# Patient Record
Sex: Female | Born: 1985 | Hispanic: Yes | Marital: Married | Smoking: Never smoker
Health system: Southern US, Community
[De-identification: ages and names within clinical notes are randomized; demographics above are authoritative.]

## PROBLEM LIST (undated history)

## (undated) DIAGNOSIS — N6452 Nipple discharge: Secondary | ICD-10-CM

## (undated) DIAGNOSIS — Z8759 Personal history of other complications of pregnancy, childbirth and the puerperium: Secondary | ICD-10-CM

## (undated) DIAGNOSIS — R7989 Other specified abnormal findings of blood chemistry: Secondary | ICD-10-CM

## (undated) DIAGNOSIS — E229 Hyperfunction of pituitary gland, unspecified: Secondary | ICD-10-CM

## (undated) HISTORY — DX: Hyperfunction of pituitary gland, unspecified: E22.9

## (undated) HISTORY — DX: Personal history of other complications of pregnancy, childbirth and the puerperium: Z87.59

## (undated) HISTORY — DX: Other specified abnormal findings of blood chemistry: R79.89

## (undated) HISTORY — PX: NO PAST SURGERIES: SHX2092

## (undated) HISTORY — DX: Nipple discharge: N64.52

---

## 2017-06-04 ENCOUNTER — Encounter: Payer: Self-pay | Admitting: Advanced Practice Midwife

## 2017-06-04 ENCOUNTER — Ambulatory Visit: Payer: BLUE CROSS/BLUE SHIELD | Admitting: Advanced Practice Midwife

## 2017-06-04 VITALS — BP 124/70 | HR 59 | Ht <= 58 in | Wt 133.0 lb

## 2017-06-04 DIAGNOSIS — N920 Excessive and frequent menstruation with regular cycle: Secondary | ICD-10-CM | POA: Diagnosis not present

## 2017-06-04 MED ORDER — NORETHIN ACE-ETH ESTRAD-FE 1-20 MG-MCG PO TABS: 1.0000 | ORAL_TABLET | Freq: Every day | ORAL | 5 refills | Status: DC

## 2017-06-04 NOTE — Progress Notes (Signed)
S: The patient is here today with complaint of heavy and prolonged bleeding with her cycle. She had Nexplanon inserted in 2015 and then removed/reinserted in March of this year. With the first Nexplanon she did not have any bleeding. Since having the new Nexplanon she has had monthly bleeding since May. Most of the months since May she has had 8 day cycles. In September the bleeding lasted for 15 days. Currently she has been bleeding since November 20th. She says the bleeding is heavy with clots. She is planning a pregnancy sometime in 2015 and so prefers to continue with the current Nexplanon. Discussion of normal variations in bleeding pattern with Nexplanon. She would like to try adding an OCP for a few months to see if that decreases her bleeding.   O: Vital Signs: BP 124/70 (BP Location: Left Arm, Patient Position: Sitting, Cuff Size: Normal)   Pulse (!) 59   Ht 4\' 9"  (1.448 m)   Wt 133 lb (60.3 kg)   LMP 05/11/2017   BMI 28.78 kg/m  Constitutional: Well nourished, well developed female in no acute distress.  HEENT: normal Skin: Warm and dry.  Cardiovascular: Regular rate and rhythm.   Psych: Alert and Oriented x3. No memory deficits. Normal mood and affect.   A: 31 yo female with menorrhagia on Nexplanon  P: Rx for Oral combined pill Return to clinic PRN  Tresea MallJane Bow Buntyn, CNM

## 2017-12-21 ENCOUNTER — Emergency Department
Admission: EM | Admit: 2017-12-21 | Discharge: 2017-12-22 | Disposition: A | Discharge status: Home / Self Care | Payer: BLUE CROSS/BLUE SHIELD | Attending: Emergency Medicine | Admitting: Emergency Medicine

## 2017-12-21 ENCOUNTER — Encounter: Payer: Self-pay | Admitting: Emergency Medicine

## 2017-12-21 ENCOUNTER — Emergency Department: Payer: BLUE CROSS/BLUE SHIELD

## 2017-12-21 ENCOUNTER — Other Ambulatory Visit: Payer: Self-pay

## 2017-12-21 DIAGNOSIS — O200 Threatened abortion: Secondary | ICD-10-CM | POA: Insufficient documentation

## 2017-12-21 DIAGNOSIS — Z79899 Other long term (current) drug therapy: Secondary | ICD-10-CM | POA: Diagnosis not present

## 2017-12-21 DIAGNOSIS — R102 Pelvic and perineal pain: Secondary | ICD-10-CM | POA: Diagnosis not present

## 2017-12-21 DIAGNOSIS — Z3A01 Less than 8 weeks gestation of pregnancy: Secondary | ICD-10-CM | POA: Insufficient documentation

## 2017-12-21 DIAGNOSIS — O209 Hemorrhage in early pregnancy, unspecified: Secondary | ICD-10-CM | POA: Diagnosis present

## 2017-12-21 LAB — CBC
HCT: 41.3 % (ref 35.0–47.0)
Hemoglobin: 14.3 g/dL (ref 12.0–16.0)
MCH: 30.8 pg (ref 26.0–34.0)
MCHC: 34.6 g/dL (ref 32.0–36.0)
MCV: 88.9 fL (ref 80.0–100.0)
PLATELETS: 248 10*3/uL (ref 150–440)
RBC: 4.64 MIL/uL (ref 3.80–5.20)
RDW: 13.7 % (ref 11.5–14.5)
WBC: 8.4 10*3/uL (ref 3.6–11.0)

## 2017-12-21 LAB — HCG, QUANTITATIVE, PREGNANCY: hCG, Beta Chain, Quant, S: 8 m[IU]/mL — ABNORMAL HIGH (ref <5)

## 2017-12-21 NOTE — ED Triage Notes (Signed)
Pt reports she is approximately [redacted] weeks pregnant and an hour ago she sated to have heavy vaginal bleeding. Pt reports she has had 1 miscarriage in past, no living children.

## 2017-12-21 NOTE — ED Provider Notes (Signed)
[facility] Emergency Department Provider Note   ____________________________________________   First MD Initiated Contact with Patient 12/21/17 2217     (approximate)  I have reviewed the triage vital signs and the nursing notes.   HISTORY  Chief Complaint Vaginal Bleeding   HP Cristina Weber is a 32 y.o. female who thinks she is about [redacted] weeks pregnant.  She began having heavy vaginal bleeding an hour ago with cramping.  She has 1 miscarriage no living children.  She still having some cramping and bleeding now.  She told me she had her last menstrual period 27 may.   History reviewed. No pertinent past medical history.  There are no active problems to display for this patient.   History reviewed. No pertinent surgical history.  Prior to Admission medications   Medication Sig Start Date End Date Taking? Authorizing Provider  norethindrone-ethinyl estradiol (JUNEL FE 1/20) 1-20 MG-MCG tablet Take 1 tablet by mouth daily. 06/04/17   Tresea Mall, CNM  TRI-LUMA 0.01-4-0.05 % CREA  04/27/17   [provider]    Allergies Aspirin  History reviewed. No pertinent family history.  Social History Social History   Tobacco Use  . Smoking status: Never Smoker  . Smokeless tobacco: Never Used  Substance Use Topics  . Alcohol use: No    Frequency: Never  . Drug use: No    Review of Systems  Constitutional: No fever/chills Eyes: No visual changes. ENT: No sore throat. Cardiovascular: Denies chest pain. Respiratory: Denies shortness of breath. Gastrointestinal: Lower abdominal pain.  No nausea, no vomiting.  No diarrhea.  No constipation. Genitourinary: Negative for dysuria. Musculoskeletal: Negative for back pain. Skin: Negative for rash. Neurological: Negative for headaches, focal weakness   ____________________________________________   PHYSICAL EXAM:  VITAL SIGNS: ED Triage Vitals  Enc Vitals Group     BP 12/21/17 2144 (!)  143/76     Pulse Rate 12/21/17 2144 61     Resp 12/21/17 2144 18     Temp 12/21/17 2144 97.9 F (36.6 C)     Temp Source 12/21/17 2144 Oral     SpO2 12/21/17 2144 100 %     Weight 12/21/17 2143 132 lb (59.9 kg)     Height 12/21/17 2143 4\' 9"  (1.448 m)     Head Circumference --      Peak Flow --      Pain Score --      Pain Loc --      Pain Edu? --      Excl. in GC? --     Constitutional: Alert and oriented. Well appearing and in no acute distress. Eyes: Conjunctivae are normal. Head: Atraumatic. Nose: No congestion/rhinnorhea. Mouth/Throat: Mucous membranes are moist.  Oropharynx non-erythematous. Neck: No stridor.   Cardiovascular: Normal rate, regular rhythm. Grossly normal heart sounds.  Good peripheral circulation. Respiratory: Normal respiratory effort.  No retractions. Lungs CTAB. Gastrointestinal: Soft and lower abdominal tenderness no distention. No abdominal bruits. No CVA tenderness. Genitourinary: Pending ultrasound Musculoskeletal: No lower extremity tenderness nor edema.  No joint effusions. Neurologic:  Normal speech and language. No gross focal neurologic deficits are appreciated. No gait instability. Skin:  Skin is warm, dry and intact. No rash noted. Psychiatric: Mood and affect are normal. Speech and behavior are normal.  ____________________________________________   LABS (all labs ordered are listed, but only abnormal results are displayed)  Labs Reviewed  WET PREP, GENITAL - Abnormal; Notable for the following components:      Result  Value   WBC, Wet Prep HPF POC FEW (*)    All other components within normal limits  HCG, QUANTITATIVE, PREGNANCY - Abnormal; Notable for the following components:   hCG, Beta Chain, Quant, S 8 (*)    All other components within normal limits  CHLAMYDIA/NGC RT PCR ([facility] ONLY)  CBC  ABO/RH    ____________________________________________  EKG   ____________________________________________  RADIOLOGY *  Official radiology report(s): US Ob Comp Less 14 Wks  Result Date: 12/22/2017 CLINICAL DATA:  32 year old pregnant female with vaginal bleeding for 2 hours. Beta HCG of 8. Estimated gestational age of [redacted] weeks 1 day by LMP. EXAM: OBSTETRIC <14 WK [place] AND TRANSVAGINAL OB US TECHNIQUE: Both transabdominal and transvaginal ultrasound examinations were performed for complete evaluation of the gestation as well as the maternal uterus, adnexal regions, and pelvic cul-de-sac. Transvaginal technique was performed to assess early pregnancy. COMPARISON:  None. FINDINGS: Intrauterine gestational sac: None Yolk sac:  Not Visualized. Embryo:  Not Visualized. Maternal uterus/adnexae: The uterus and ovaries are unremarkable. Endometrial stripe is unremarkable measuring 10 mm. A trace amount of free pelvic fluid is noted. No adnexal masses. IMPRESSION: 1. No IUP or adnexal mass visualized. Differential diagnosis includes recent spontaneous abortion, IUP too early to visualize, and occult ectopic pregnancy. Recommend close follow up of quantitative B-HCG levels, and follow up US as clinically warranted. Electronically Signed   By: Harmon Pier M.D.   On: 12/22/2017 00:02   US Ob Transvaginal  Result Date: 12/22/2017 CLINICAL DATA:  32 year old pregnant female with vaginal bleeding for 2 hours. Beta HCG of 8. Estimated gestational age of [redacted] weeks 1 day by LMP. EXAM: OBSTETRIC <14 WK [place] AND TRANSVAGINAL OB US TECHNIQUE: Both transabdominal and transvaginal ultrasound examinations were performed for complete evaluation of the gestation as well as the maternal uterus, adnexal regions, and pelvic cul-de-sac. Transvaginal technique was performed to assess early pregnancy. COMPARISON:  None. FINDINGS: Intrauterine gestational sac: None Yolk sac:  Not Visualized. Embryo:  Not Visualized. Maternal uterus/adnexae: The  uterus and ovaries are unremarkable. Endometrial stripe is unremarkable measuring 10 mm. A trace amount of free pelvic fluid is noted. No adnexal masses. IMPRESSION: 1. No IUP or adnexal mass visualized. Differential diagnosis includes recent spontaneous abortion, IUP too early to visualize, and occult ectopic pregnancy. Recommend close follow up of quantitative B-HCG levels, and follow up US as clinically warranted. Electronically Signed   By: Harmon Pier M.D.   On: 12/22/2017 00:02    ____________________________________________   PROCEDURES  Procedure(s) performed:   Procedures  Critical Care performed:   ____________________________________________   INITIAL IMPRESSION / ASSESSMENT AND PLAN / ED COURSE  I discussed the patient with Dr. Valentino Saxon she will follow-up.  I also told the patient and her husband that this could be a completed miscarriage and she still bleeding or could be a very early pregnancy which is cannot see it yet but they need to return for worse pain bleeding or any other problems.  And they need to give Dr. Oretha Milch office and [facility] a call in the morning and set up a follow-up in 2 days.  I understand this.         ____________________________________________   FINAL CLINICAL IMPRESSION(S) / ED DIAGNOSES  Final diagnoses:  Threatened miscarriage     ED Discharge Orders    None       Note:  This document was prepared using Dragon voice recognition software and may include unintentional dictation errors.  Arnaldo NatalMalinda, Tayvian Holycross F, MD 12/22/17 [phone]

## 2017-12-21 NOTE — ED Notes (Signed)
Patient transported to Ultrasound 

## 2017-12-22 ENCOUNTER — Ambulatory Visit: Payer: BLUE CROSS/BLUE SHIELD | Admitting: Obstetrics and Gynecology

## 2017-12-22 ENCOUNTER — Encounter: Payer: Self-pay | Admitting: Obstetrics and Gynecology

## 2017-12-22 VITALS — BP 116/76 | HR 64 | Ht 59.0 in | Wt 130.5 lb

## 2017-12-22 DIAGNOSIS — O039 Complete or unspecified spontaneous abortion without complication: Secondary | ICD-10-CM

## 2017-12-22 LAB — WET PREP, GENITAL
CLUE CELLS WET PREP: NONE SEEN
Sperm: NONE SEEN
TRICH WET PREP: NONE SEEN
YEAST WET PREP: NONE SEEN

## 2017-12-22 LAB — CHLAMYDIA/NGC RT PCR (ARMC ONLY)
Chlamydia Tr: NOT DETECTED
N gonorrhoeae: NOT DETECTED

## 2017-12-22 LAB — ABO/RH: ABO/RH(D): O POS

## 2017-12-22 NOTE — ED Notes (Signed)
MD at bedside to update family and discuss ultrasound results

## 2017-12-22 NOTE — Discharge Instructions (Addendum)
Please follow-up with Dr. Valentino Saxonherry who is on-call for [facility].  Please return for worse pain fever or bleeding.  Also please return if you feel weak or lightheaded.

## 2017-12-22 NOTE — Patient Instructions (Signed)
Aborto espontáneo °(Miscarriage) °El aborto espontáneo es la pérdida de un bebé que no ha nacido (feto) antes de la semana 20 del embarazo. La mayor parte de estos abortos ocurre en los primeros 3 meses. En algunos casos ocurre antes de que la mujer sepa que está embarazada. También se denomina "aborto espontáneo" o "pérdida prematura del embarazo". El aborto espontáneo puede ser una experiencia que afecte emocionalmente a la persona. Converse con su médico si tiene dudas, cómo es el proceso de duelo, y sobre planes futuros de embarazo. °CAUSAS °· Algunos problemas cromosómicos pueden hacer imposible que el bebé se desarrolle normalmente. Los problemas con los genes o cromosomas del bebé son generalmente el resultado de errores que se producen, por casualidad, cuando el embrión se divide y crece. Estos problemas no se heredan de los padres. °· Infección en el cuello del útero. °· Problemas hormonales. °· Problemas en el cuello del útero, como tener un útero incompetente. Esto ocurre cuando los tejidos no son lo suficientemente fuertes como para contener el embarazo. °· Problemas del útero, como un útero con forma anormal, los fibromas o anormalidades congénitas. °· Ciertas enfermedades crónicas. °· No fume, no beba alcohol, ni consuma drogas. °· Traumatismos °A veces, la causa es desconocida. °SÍNTOMAS °· Sangrado o manchado vaginal, con o sin cólicos o dolor. °· Dolor o cólicos en el abdomen o en la cintura. °· Eliminación de líquido, tejidos o coágulos grandes por la vagina. ° °DIAGNÓSTICO °El médico le hará un examen físico. También le indicará una ecografía para confirmar el aborto. Es posible que se realicen análisis de sangre. °TRATAMIENTO °· En algunos casos el tratamiento no es necesario, si se eliminan naturalmente todos los tejidos embrionarios que se encontraban en el útero. Si el feto o la placenta quedan dentro del útero (aborto incompleto), pueden infectarse, los tejidos que quedan pueden infectarse y  deben retirarse. Generalmente se realiza un procedimiento de dilatación y curetaje (D y C). Durante el procedimiento de dilatación y curetaje, el cuello del útero se abre (dilata) y se retira cualquier resto de tejido fetal o placentario del útero. °· Si hay una infección, le recetarán antibióticos. Podrán recetarle otros medicamentos para reducir el tamaño del útero (contraerlo) si hay una mucho sangrado. °· Si su sangre es Rh negativa y su bebé es Rh positivo, usted necesitará la inyección de inmunoglobulina Rh. Esta inyección protegerá a los futuros bebés de tener problemas de compatibilidad Rh en futuros embarazos. ° °INSTRUCCIONES PARA EL CUIDADO EN EL HOGAR °· El médico le indicará reposo en cama o le permitirá realizar actividades livianas. Vuelva a la actividad lentamente o según las indicaciones de su médico. °· Pídale a alguien que la ayude con las responsabilidades familiares y del hogar durante este tiempo. °· Lleve un registro de la cantidad y la saturación de las toallas higiénicas que utiliza cada día. Anote esta información °· No use tampones. No No se haga duchas vaginales ni tenga relaciones sexuales hasta que el médico la autorice. °· Sólo tome medicamentos de venta libre o recetados para calmar el dolor o el malestar, según las indicaciones de su médico. °· No tome aspirina. La aspirina puede ocasionar hemorragias. °· Concurra puntualmente a las citas de control con el médico. °· Si usted o su pareja tienen dificultades con el duelo, hable con su médico para buscar la ayuda psicológica que los ayude a enfrentar la pérdida del embarazo. Permítase el tiempo suficiente de duelo antes de quedar embarazada nuevamente. ° °SOLICITE ATENCIÓN MÉDICA DE INMEDIATO SI: °·   Siente calambres intensos o dolor en la espalda o en el abdomen. °· Tiene fiebre. °· Elimina grandes coágulos de sangre (del tamaño de una nuez o más) o tejidos por la vagina. Guarde lo que ha eliminado para que su médico lo examine. °· La  hemorragia aumenta. °· Observa una secreción vaginal espesa y con mal olor. °· Se siente mareada, débil, o se desmaya. °· Siente escalofríos. ° °ASEGÚRESE DE QUE: °· Comprende estas instrucciones. °· Controlará su enfermedad. °· Solicitará ayuda de inmediato si no mejora o si empeora. ° °Esta información no tiene como fin reemplazar el consejo del médico. Asegúrese de hacerle al médico cualquier pregunta que tenga. °Document Released: 03/18/2005 Document Revised: 10/03/2012 Document Reviewed: 07/28/2011 °Elsevier Interactive Patient Education © 2017 Elsevier Inc. ° °

## 2017-12-22 NOTE — Progress Notes (Signed)
  Subjective:     Patient ID: Cristina Weber, female   DOB: 09/25/1985, 32 y.o.   MRN: [id number]  HPI Here for f/u from ED visit yesterday. Was diagnosed with miscarriage. States bleeding had got heavier over night but now less, moderate cramping not relieved by tylenol.   Does report they were actively trying for pregnancy, and had a spontaneous miscarriage at [redacted] weeks gestation 5 years ago. Otherwise never been pregnant to term. 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  Denies any health issues.  Review of Systems  Constitutional: Negative.   HENT: Negative.   Eyes: Negative.   Respiratory: Negative.   Cardiovascular: Negative.   Gastrointestinal: Negative.   Endocrine: Negative.   Genitourinary: Negative.   Musculoskeletal: Negative.   Allergic/Immunologic: Negative.   Neurological: Negative.   Hematological: Negative.   Psychiatric/Behavioral: Negative.        Objective:   Physical Exam A&O x4 Well groomed female, mildly tearful. Blood pressure 116/76, pulse 64, height 4\' 11"  (1.499 m), weight 130 lb 8 oz (59.2 kg), last menstrual period 11/15/2017. Pelvic exam: normal external genitalia, vulva, vagina, cervix, uterus and adnexa, moderate dark rubra noted.    Assessment:     miscarriage    Plan:     Labs obtained-will follow up accordingly. Counseled at length on miscarriage, common causes, and future pregnancy options. RTC in 2 weeks for follow up ultrasound and more counseling on future pregnancy attempts.   > 50 of 30 minute visit spent in counseling.  Talik Casique,CMN

## 2017-12-28 ENCOUNTER — Encounter: Payer: Self-pay | Admitting: Obstetrics and Gynecology

## 2017-12-28 LAB — CBC WITH DIFFERENTIAL/PLATELET
BASOS ABS: 0 10*3/uL (ref 0.0–0.2)
BASOS: 0 %
EOS (ABSOLUTE): 0 10*3/uL (ref 0.0–0.4)
EOS: 0 %
HEMATOCRIT: 43.6 % (ref 34.0–46.6)
HEMOGLOBIN: 14 g/dL (ref 11.1–15.9)
IMMATURE GRANS (ABS): 0 10*3/uL (ref 0.0–0.1)
Immature Granulocytes: 0 %
LYMPHS ABS: 1.6 10*3/uL (ref 0.7–3.1)
Lymphs: 15 %
MCH: 29.4 pg (ref 26.6–33.0)
MCHC: 32.1 g/dL (ref 31.5–35.7)
MCV: 92 fL (ref 79–97)
MONOCYTES: 5 %
Monocytes Absolute: 0.6 10*3/uL (ref 0.1–0.9)
NEUTROS ABS: 8.6 10*3/uL — AB (ref 1.4–7.0)
Neutrophils: 80 %
Platelets: 267 10*3/uL (ref 150–450)
RBC: 4.76 x10E6/uL (ref 3.77–5.28)
RDW: 13.7 % (ref 12.3–15.4)
WBC: 10.8 10*3/uL (ref 3.4–10.8)

## 2017-12-28 LAB — TOXOPLASMA GONDII ANTIBODY, IGG

## 2017-12-28 LAB — BETA HCG QUANT (REF LAB): HCG QUANT: 4 m[IU]/mL

## 2017-12-28 LAB — HSV 1 ANTIBODY, IGG: HSV 1 Glycoprotein G Ab, IgG: 38.6 index — ABNORMAL HIGH (ref 0.00–0.90)

## 2017-12-28 LAB — B12 AND FOLATE PANEL
Folate: 18.8 ng/mL (ref >3.0)
Vitamin B-12: 518 pg/mL (ref 232–1245)

## 2017-12-28 LAB — ANA,IFA RA DIAG PNL W/RFLX TIT/PATN
ANA TITER 1: NEGATIVE
Cyclic Citrullin Peptide Ab: 7 units (ref 0–19)
Rhuematoid fact SerPl-aCnc: <10 IU/mL (ref 0.0–13.9)

## 2017-12-28 LAB — HIV ANTIBODY (ROUTINE TESTING W REFLEX): HIV SCREEN 4TH GENERATION: NONREACTIVE

## 2017-12-28 LAB — HSV 2 ANTIBODY, IGG

## 2017-12-28 LAB — VITAMIN D 25 HYDROXY (VIT D DEFICIENCY, FRACTURES): Vit D, 25-Hydroxy: 28.1 ng/mL — ABNORMAL LOW (ref 30.0–100.0)

## 2017-12-28 LAB — RUBELLA SCREEN: Rubella Antibodies, IGG: 17.4 index (ref >0.99)

## 2017-12-28 LAB — CMV ANTIBODY, IGG (EIA): CMV AB - IGG: 1.7 U/mL — AB (ref 0.00–0.59)

## 2017-12-28 LAB — THYROID PANEL WITH TSH
Free Thyroxine Index: 2 (ref 1.2–4.9)
T3 UPTAKE RATIO: 32 % (ref 24–39)
T4, Total: 6.3 ug/dL (ref 4.5–12.0)
TSH: 1.24 u[IU]/mL (ref 0.450–4.500)

## 2017-12-28 LAB — RPR: RPR Ser Ql: NONREACTIVE

## 2018-01-04 ENCOUNTER — Ambulatory Visit (INDEPENDENT_AMBULATORY_CARE_PROVIDER_SITE_OTHER): Payer: BLUE CROSS/BLUE SHIELD

## 2018-01-04 ENCOUNTER — Ambulatory Visit (INDEPENDENT_AMBULATORY_CARE_PROVIDER_SITE_OTHER): Payer: BLUE CROSS/BLUE SHIELD | Admitting: Obstetrics and Gynecology

## 2018-01-04 ENCOUNTER — Encounter: Payer: Self-pay | Admitting: Obstetrics and Gynecology

## 2018-01-04 ENCOUNTER — Other Ambulatory Visit: Payer: BLUE CROSS/BLUE SHIELD

## 2018-01-04 VITALS — BP 103/70 | HR 55 | Ht <= 58 in | Wt 129.1 lb

## 2018-01-04 DIAGNOSIS — Z5189 Encounter for other specified aftercare: Secondary | ICD-10-CM | POA: Diagnosis not present

## 2018-01-04 DIAGNOSIS — N8301 Follicular cyst of right ovary: Secondary | ICD-10-CM | POA: Diagnosis not present

## 2018-01-04 DIAGNOSIS — O039 Complete or unspecified spontaneous abortion without complication: Secondary | ICD-10-CM | POA: Diagnosis not present

## 2018-01-04 NOTE — Progress Notes (Signed)
  Subjective:     Patient ID: Cristina SakaiLuisa Devery, female   DOB: 06/15/1986, 32 y.o.   MRN: [id number]  HPI  Here to review ultrasound as follow up to miscarriage. Reports moderate cramping and bleeding for 7 days, but has felt fine since then. Reviewed labs obtained on 12/21/17.    Review of Systems negative    Objective:   Physical Exam   A&O Well groomed female in no distress Vitals:   01/04/18 1041  Weight: 129 lb 1.6 oz (58.6 kg)  Height: 4\' 10"  (1.473 m)    Ultrasound Findings:  The uterus measures 8.7 x 4.7 x 3.7 cm. Echo texture is homogeneous without evidence of focal masses. The Endometrium measures 12.4 mm.  Right Ovary measures 3.5 x 2.7 x 2.7 cm and contains a dominant follicle/small simple cyst (probably physiological) measuring 1.4 x 1.9 x 1.7 cm. It is normal in appearance. Left Ovary measures 2.5 x 1.8 x 1.6 cm. It is normal appearance. Survey of the adnexa demonstrates no adnexal masses. There is no free fluid in the cul de sac.  Impression: 1. Anteverted uterus appears of normal size and contour. 2. Endometrium measures 12.4 mm. 3. Bilateral ovaries appear WNL. - Right ovary contains probable physiological dominant follicle/simple cyst measuring 1.4 x 1.9 x 1.7 cm.    Assessment:     F/u miscarriage    Plan:     Reassured of normal findings. Recommended waiting till after next menses before trying for another pregnancy. To call the office with next UPT+ and come in for progesterone labs and quant bHcg.   Jonda Alanis,CNM

## 2018-01-10 ENCOUNTER — Telehealth: Payer: Self-pay | Admitting: Obstetrics and Gynecology

## 2018-01-10 NOTE — Telephone Encounter (Signed)
The patients husband called and stated that se would like to speak with a nurse in regards to the patients most recent bloodwork to get her results. Please advise.

## 2018-02-11 ENCOUNTER — Encounter: Payer: Self-pay | Admitting: Obstetrics and Gynecology

## 2018-02-11 ENCOUNTER — Ambulatory Visit: Payer: BLUE CROSS/BLUE SHIELD | Admitting: Obstetrics and Gynecology

## 2018-02-11 VITALS — BP 138/94 | HR 68 | Ht <= 58 in | Wt 131.0 lb

## 2018-02-11 DIAGNOSIS — N643 Galactorrhea not associated with childbirth: Secondary | ICD-10-CM | POA: Diagnosis not present

## 2018-02-11 NOTE — Progress Notes (Signed)
  Subjective:     Patient ID: Cristina Weber, female   DOB: 02/13/1986, 32 y.o.   MRN: [id number]  HPI Reports noticing milky white discharge out of both breast this am. Denies tenderness or other breast changes. Reports have similar episode about 4-5 years ago. Did have the flu 4 days ago with headache, low grade fever, and malaise.  Also is s/p missed abortion last month. States LMP 01/20/18 with lighted bleeding and clots small with mucus. Has had unprotected sex since then.   Review of Systems  Constitutional: Positive for fever.  Eyes: Negative.   Respiratory: Negative.   Cardiovascular: Negative.   Gastrointestinal: Negative.   Endocrine: Negative.   Genitourinary: Negative.   Musculoskeletal: Negative.   Neurological: Positive for dizziness, light-headedness and headaches.  Hematological: Negative.   Psychiatric/Behavioral: Negative.        Objective:   Physical Exam A&Ox4 Well groomed female in no distress Blood pressure (!) 138/94, pulse 68, height 4\' 9"  (1.448 m), weight 131 lb (59.4 kg), last menstrual period 01/20/2018, unknown if currently breastfeeding. Breasts: breasts appear normal, no suspicious masses, no skin or nipple changes or axillary nodes, symmetric fibrous changes in both upper outer quadrants.    Assessment:     galactorrhea    Plan:     Counseled on normal causes of nipple discharge and recommended labs. Will return next day for labs and we will follow up accordingly.  Mohogany Toppins,CNM

## 2018-02-11 NOTE — Patient Instructions (Signed)
Galactorrhea  Galactorrhea is the flow of a milky fluid (discharge) from the breast. It is different from normal milk in nursing mothers. It can be caused by many things. Most cases are not serious and do not require treatment. Watch your condition to make sure it goes away.  Follow these instructions at home:   Take medicines only as told by your doctor.   Do not squeeze your breasts or nipples.   Avoid any touching of your breasts during sexual activity.   Perform a breast self-exam only once a month.   Avoid clothes that rub on your nipples.   Use breast pads to absorb the fluid.   Wear a support bra or a breast binder.   Keep all follow-up visits as told by your doctor. This is important.  Contact a doctor if:   You have hot flashes.   You have vaginal dryness.   You have a lack of sexual desire.   You stop having menstrual periods, or they are far apart or not regular.   You have headaches.   You have vision problems.  Get help right away if:   Your breast discharge is bloody or yellowish white (puslike).   You have breast pain.   You feel a lump in your breast.   Your breast shows wrinkling or dimpling.   Your breast becomes red and swollen.  This information is not intended to replace advice given to you by your health care provider. Make sure you discuss any questions you have with your health care provider.  Document Released: 07/11/2010 Document Revised: 11/14/2015 Document Reviewed: 01/09/2014  Elsevier Interactive Patient Education  2018 Elsevier Inc.

## 2018-02-14 ENCOUNTER — Other Ambulatory Visit: Payer: BLUE CROSS/BLUE SHIELD

## 2018-02-14 NOTE — Addendum Note (Signed)
Addended by: Brooke DareSICK, Chemere Steffler L on: 02/14/2018 08:35 AM   Modules accepted: Orders

## 2018-02-15 ENCOUNTER — Ambulatory Visit (INDEPENDENT_AMBULATORY_CARE_PROVIDER_SITE_OTHER): Payer: BLUE CROSS/BLUE SHIELD | Admitting: Obstetrics and Gynecology

## 2018-02-15 ENCOUNTER — Encounter: Payer: Self-pay | Admitting: Obstetrics and Gynecology

## 2018-02-15 VITALS — BP 148/80 | HR 58 | Ht <= 58 in | Wt 129.1 lb

## 2018-02-15 DIAGNOSIS — N643 Galactorrhea not associated with childbirth: Secondary | ICD-10-CM

## 2018-02-15 DIAGNOSIS — E229 Hyperfunction of pituitary gland, unspecified: Secondary | ICD-10-CM

## 2018-02-15 DIAGNOSIS — R7989 Other specified abnormal findings of blood chemistry: Secondary | ICD-10-CM

## 2018-02-15 LAB — BETA HCG QUANT (REF LAB): hCG Quant: <1 m[IU]/mL

## 2018-02-15 LAB — PROGESTERONE: Progesterone: 0.2 ng/mL

## 2018-02-15 LAB — PROLACTIN: Prolactin: 57.6 ng/mL — ABNORMAL HIGH (ref 4.8–23.3)

## 2018-02-15 NOTE — Patient Instructions (Signed)
steps you can take to lower your prolactin levels include:  changing your diet and keeping your stress levels down stopping high-intensity workouts or activities that overwhelm you avoiding clothing that makes your chest uncomfortable avoiding activities and clothing that overstimulate your nipples taking vitamin B-6 and vitamin E supplements

## 2018-02-15 NOTE — Progress Notes (Signed)
  Subjective:     Patient ID: Cristina Weber, female   DOB: 11/27/1985, 32 y.o.   MRN: [id number]  HPI Reports seeing clear to blood bilateral expressed out of both nipples. Concerned as she feels like it is getting worse and also wanted to review labs obtained yesterday. Just started menses. Denies breast pain or any other changes.   Review of Systems  All other systems reviewed and are negative.      Objective:   Physical Exam A&OX4 Well groomed female Blood pressure (!) 148/80, pulse (!) 58, height 4\' 9"  (1.448 m), weight 129 lb 1.6 oz (58.6 kg), last menstrual period 01/20/2018, unknown if currently breastfeeding. Breasts: breasts appear normal, no suspicious masses, no skin or nipple changes or axillary nodes, bilateral clear nipple discharge with scant blood in the discharge noted. Thyroid normal on exam.     Assessment:     Galactorrhea of both breast Elevated prolactin levels    Plan:     More labs obtained. Counseled on common causes of elevated prolactin/galactorrhea including recent miscarriage, thyroid disorders, stress, certain foods and medications and pituitary tumor. Diagnostic MMG and breasts ultrasound ordered and will follow up accordingly.   Melody Shambley,CNM

## 2018-02-16 LAB — THYROID PANEL WITH TSH
Free Thyroxine Index: 2.3 (ref 1.2–4.9)
T3 Uptake Ratio: 31 % (ref 24–39)
T4, Total: 7.5 ug/dL (ref 4.5–12.0)
TSH: 2.25 u[IU]/mL (ref 0.450–4.500)

## 2018-02-16 LAB — FSH/LH
FSH: 5.2 m[IU]/mL
LH: 5.5 m[IU]/mL

## 2018-02-16 LAB — PARATHYROID HORMONE, INTACT (NO CA): PTH: 32 pg/mL (ref 15–65)

## 2018-02-17 ENCOUNTER — Ambulatory Visit
Admission: RE | Admit: 2018-02-17 | Discharge: 2018-02-17 | Disposition: A | Discharge status: Home / Self Care | Payer: BLUE CROSS/BLUE SHIELD | Source: Ambulatory Visit | Attending: Obstetrics and Gynecology | Admitting: Obstetrics and Gynecology

## 2018-02-17 DIAGNOSIS — N643 Galactorrhea not associated with childbirth: Secondary | ICD-10-CM

## 2018-02-28 ENCOUNTER — Telehealth: Payer: Self-pay | Admitting: Obstetrics and Gynecology

## 2018-02-28 NOTE — Telephone Encounter (Signed)
The patient's spouse called and stated that the patient is still experiencing some severe symptoms, The patient is having headache, discharge, and would like to know the results from the last exam she had done. No other information was disclosed. Please advise.

## 2018-03-01 NOTE — Telephone Encounter (Signed)
pls advise, looks like she needs further testing??

## 2018-03-01 NOTE — Telephone Encounter (Signed)
Can you please review this patients chart and see if you have any recommendations?

## 2018-03-01 NOTE — Telephone Encounter (Signed)
The patient's Prolactin was elevated back in August. She should have it repeated now and if still elevated, would need a referral to an endocrinologist to rule out a possible prolactinoma in light of her headaches and persistent milky nipple discharge.  She also needs a referral to a General Surgery for further follow up of her mammogram as suggested by the radiologist due to uniductal expression of the bloody nipple discharge.

## 2018-03-02 ENCOUNTER — Ambulatory Visit (INDEPENDENT_AMBULATORY_CARE_PROVIDER_SITE_OTHER): Payer: BLUE CROSS/BLUE SHIELD | Admitting: Surgery

## 2018-03-02 ENCOUNTER — Other Ambulatory Visit: Payer: Self-pay | Admitting: Obstetrics and Gynecology

## 2018-03-02 ENCOUNTER — Encounter: Payer: Self-pay | Admitting: Surgery

## 2018-03-02 VITALS — BP 122/80 | HR 80 | Temp 97.9°F | Ht <= 58 in | Wt 127.0 lb

## 2018-03-02 DIAGNOSIS — R7989 Other specified abnormal findings of blood chemistry: Secondary | ICD-10-CM

## 2018-03-02 DIAGNOSIS — E229 Hyperfunction of pituitary gland, unspecified: Principal | ICD-10-CM

## 2018-03-02 DIAGNOSIS — N6452 Nipple discharge: Secondary | ICD-10-CM

## 2018-03-02 DIAGNOSIS — N643 Galactorrhea not associated with childbirth: Secondary | ICD-10-CM

## 2018-03-02 NOTE — Telephone Encounter (Signed)
Discussed concerns and need for follow up lab and surgery consult with spouse. She ill come in Friday morning of this week for labs. Orders placed.

## 2018-03-02 NOTE — Patient Instructions (Addendum)
La vamos a ver en 3 meses para estar seguro que no le siga saliendo sangre del pezon.  El Dr. Aleen Campi recomienda que no quede embarazada por 3 meses para estar seguro que no sea nada serio.

## 2018-03-03 ENCOUNTER — Encounter: Payer: Self-pay | Admitting: Surgery

## 2018-03-03 NOTE — Progress Notes (Signed)
03/03/2018  Reason for Visit:  Left breast bloody discharge from nipple  Referring Provider:  Harlow MaresMelody Shambley, CNM  History of Present Illness: Cristina Weber is a 32 y.o. female who presents with new diagnosis of left breast bloody nipple discharge.  She reports that about three weeks ago she noticed bilateral breast milk discharge.  She had an abortion about two months ago.  Her prolactin level was also elevated.  However, she also had a few days after milk discharge onset, bloody discharge from the left nipple only.  She had a mammogram and ultrasound on 02/17/18.  This showed no suspicious findings or masses, particularly on the left breast.  The bloody discharge stopped a few days after the mammogram and has not returned since.  She denies any breast tenderness, skin changes, nipple retraction, or any palpable masses.  There is no history in her family of breast cancer.  Past Medical History: Past Medical History:  Diagnosis Date  . History of miscarriage      Past Surgical History: Past Surgical History:  Procedure Laterality Date  . NO PAST SURGERIES      Home Medications: Prior to Admission medications   Medication Sig Start Date End Date Taking? Authorizing Provider  Prenatal Vit-Fe Fumarate-FA (PRENATAL MULTIVITAMIN) TABS tablet Take 1 tablet by mouth daily at 12 noon.   Yes [provider]    Allergies: Allergies  Allergen Reactions  . Aspirin Rash    Social History:  reports that she has never smoked. She has never used smokeless tobacco. She reports that she does not drink alcohol or use drugs.   Family History: Family History  Problem Relation Age of Onset  . Diabetes Father   . Hypertension Father   . Cancer Neg Hx     Review of Systems: Review of Systems  Constitutional: Negative for chills and fever.  HENT: Negative for hearing loss.   Eyes: Negative for blurred vision.  Respiratory: Negative for shortness of breath.   Cardiovascular:  Negative for chest pain.  Gastrointestinal: Negative for abdominal pain.  Genitourinary: Negative for dysuria.  Musculoskeletal: Negative for myalgias.  Skin: Negative for rash.  Neurological: Negative for dizziness.  Psychiatric/Behavioral: Negative for depression.    Physical Exam BP 122/80   Pulse 80   Temp 97.9 F (36.6 C) (Oral)   Ht 4\' 9"  (1.448 m)   Wt 127 lb (57.6 kg)   LMP 02/11/2018 (Exact Date)   BMI 27.48 kg/m  CONSTITUTIONAL: No acute distress HEENT:  Normocephalic, atraumatic, extraocular motion intact. NECK: Trachea is midline, and there is no jugular venous distension. RESPIRATORY:  Lungs are clear, and breath sounds are equal bilaterally. Normal respiratory effort without pathologic use of accessory muscles. CARDIOVASCULAR: Heart is regular without murmurs, gallops, or rubs. BREAST:  Left breast without any palpable masses or deformities.  There is milky discharge on manual pressure / expression, but not spontaneous.  There was no blood on manual pressure.  There is no lymphadenopathy on the left axilla.  The right breast and axilla were negative for any masses or deformities and only had milky discharge on manual pressure. GI: The abdomen is soft, nondistended, nontender.  MUSCULOSKELETAL:  Normal muscle strength and tone in all four extremities.  No peripheral edema or cyanosis. SKIN: Skin turgor is normal. There are no pathologic skin lesions.  NEUROLOGIC:  Motor and sensation is grossly normal.  Cranial nerves are grossly intact. PSYCH:  Alert and oriented to person, place and time. Affect is normal.  Laboratory Analysis: Labs from 01/2018: LH 5.5, FSH 5.2, Prolactin 57.6  Imaging: Mammogram and Ultrasound 8/29: FINDINGS: No suspicious mammographic findings are identified in either breast.  Mammographic images were processed with CAD.  On physical exam, I am able to express multi duct milky discharge bilaterally. Additionally, bloody discharge is  expressed from a single duct at the 3 o'clock position on the left nipple.  Targeted ultrasound is performed, showing no focal or suspicious sonographic findings. Evaluation of the entire retroareolar regions was performed bilaterally.  IMPRESSION: No suspicious mammographic or sonographic findings to explain the patient's nipple discharge.  Assessment and Plan: This is a 32 y.o. female who presents with left breast bloody nipple discharge.  I have independently viewed the patient's imaging study and reviewed her laboratory studies.  Overall there are no suspicious findings on her mammogram and on her labs, her prolactin is elevated.  Discussed with the patient that there are different potential reasons for bloody discharge.  Related to pregnancy, there is a percentage of women who do experience bloody discharge, which ends up being physiological. Given her recent abortion 2 months ago and her elevated prolactin level, this could be related.  Other potential etiologies include intraductal papilloma which is a benign etiology, though there is potential that it can harbor DCIS, and then also cancer as the etiology.  Discussed with the patient that given the negative findings on mammogram and ultrasound, cancer is less likely.  She has not had any further bloody discharge in about 10 days.  Recommended to the patient that we wait about 3 months to see if the bloody nipple discharge and bilateral milk discharge stop and if so, then this was related to the pregnancy and abortion.  If there are any other episodes of bloody discharge, then we should go to the OR for further evaluation and resection of a culprit duct.  The patient understands this plan and is in agreement.  She is also due for further labs this week to follow up on her elevated prolactin.  Face-to-face time spent with the patient and care providers was 60 minutes, with more than 50% of the time spent counseling, educating, and coordinating  care of the patient.     Howie Ill, MD [facility]

## 2018-03-04 ENCOUNTER — Other Ambulatory Visit: Payer: BLUE CROSS/BLUE SHIELD

## 2018-03-04 DIAGNOSIS — R7989 Other specified abnormal findings of blood chemistry: Secondary | ICD-10-CM

## 2018-03-04 DIAGNOSIS — E229 Hyperfunction of pituitary gland, unspecified: Principal | ICD-10-CM

## 2018-03-05 LAB — PROLACTIN: Prolactin: 84.2 ng/mL — ABNORMAL HIGH (ref 4.8–23.3)

## 2018-03-08 ENCOUNTER — Encounter: Payer: Self-pay | Admitting: Surgery

## 2018-03-08 ENCOUNTER — Telehealth: Payer: Self-pay | Admitting: *Deleted

## 2018-03-08 ENCOUNTER — Other Ambulatory Visit: Payer: Self-pay | Admitting: Obstetrics and Gynecology

## 2018-03-08 ENCOUNTER — Ambulatory Visit: Payer: BLUE CROSS/BLUE SHIELD | Admitting: Surgery

## 2018-03-08 DIAGNOSIS — R7989 Other specified abnormal findings of blood chemistry: Secondary | ICD-10-CM | POA: Insufficient documentation

## 2018-03-08 DIAGNOSIS — E229 Hyperfunction of pituitary gland, unspecified: Secondary | ICD-10-CM

## 2018-03-08 DIAGNOSIS — N6452 Nipple discharge: Secondary | ICD-10-CM | POA: Diagnosis not present

## 2018-03-08 NOTE — Telephone Encounter (Signed)
Mailed all info to pt 

## 2018-03-08 NOTE — Patient Instructions (Addendum)
Please call our office and ask to speak with Angie after your visit with Endocrinologist.   If you choose to move forward with surgery call our office and we can get this scheduled.

## 2018-03-08 NOTE — Progress Notes (Signed)
03/08/2018  History of Present Illness: Cristina Weber is a 32 y.o. female seen last week with left breast bloody nipple discharge.  This had happened about 2-3 weeks prior and had not recurred since.  She had a recent miscarriage two months ago and also has an elevated prolactin level which has been followed by Dr. Valentino Saxonherry.  Given her elevated prolactin, she was just given a referral to endocrinology for further evaluation.  She called yesterday to the office because she had another episode of bloody nipple discharge, which happened on Saturday 9/14.  It was a one time episode and has not happened since.  She does examine her breasts every day.  Denies any other issues, particularly no skin color changes, nipple retraction, pain, erythema.  Past Medical History: Past Medical History:  Diagnosis Date  . Bloody discharge from left nipple   . Elevated prolactin level (HCC)   . History of miscarriage      Past Surgical History: Past Surgical History:  Procedure Laterality Date  . NO PAST SURGERIES      Home Medications: Prior to Admission medications   Medication Sig Start Date End Date Taking? Authorizing Provider  Prenatal Vit-Fe Fumarate-FA (PRENATAL MULTIVITAMIN) TABS tablet Take 1 tablet by mouth daily at 12 noon.   Yes [provider]    Allergies: Allergies  Allergen Reactions  . Aspirin Rash    Review of Systems: Review of Systems  Constitutional: Negative for chills and fever.  Respiratory: Negative for shortness of breath.   Cardiovascular: Negative for chest pain.  Gastrointestinal: Negative for abdominal pain.  Skin: Negative for rash.       Left breast bloody nipple discharge.    Physical Exam BP 115/78   Pulse 60   Temp 97.7 F (36.5 C) (Temporal)   Resp 18   Ht 4\' 9"  (1.448 m)   Wt 128 lb 6.4 oz (58.2 kg)   LMP 02/11/2018 (Exact Date)   SpO2 98%   BMI 27.79 kg/m  CONSTITUTIONAL: No acute distress HEENT:  Normocephalic, atraumatic,  extraocular motion intact. RESPIRATORY:  Lungs are clear, and breath sounds are equal bilaterally. Normal respiratory effort without pathologic use of accessory muscles. CARDIOVASCULAR: Heart is regular without murmurs, gallops, or rubs. BREAST:  Left breast only examined today.  No palpable masses or axillary lymphadenopathy noted.  On manual expression/pressure, there is only milky discharge and no bloody discharge from nipple. GI: The abdomen is soft, nontender, nondistended.  NEUROLOGIC:  Motor and sensation is grossly normal.  Cranial nerves are grossly intact. PSYCH:  Alert and oriented to person, place and time. Affect is normal.  Labs/Imaging: None  Assessment and Plan: This is a 32 y.o. female with bilateral milky breast discharge and left sided bloody discharge.    Discussed with the patient that we can offer surgery to investigate the duct that is causing her bloody discharge.  However, at this point, it is unclear if the bloody discharge could be due to an etiology like an intraductal papilloma, vs possibly be physiologic related to the elevated prolactin level and her recent miscarriage.  There is a small percentage of women that can have bloody discharge during pregnancy or lactation, and it is unclear if this plays any role at this point.  Dr. Valentino Saxonherry has set a referral for endocrinology and I think at this point it is reasonable to get their opinion.  If there is no relationship between the elevated prolactin and milky discharge with the bloody discharge, then I  would favor to proceed with surgery to excise that involved duct.  She agrees with this.  However, I also can sense that the patient has some anxiety related to this issue.  She definitely can change her mind and if she wishes to just proceed to surgery before seeing endocrinology, I'd be happy to schedule her.  She does know that I will not be available between 9/26 and 10/15, but if she does change her mind, we can schedule  her for next week or when I get back.  Face-to-face time spent with the patient and care providers was 25 minutes, with more than 50% of the time spent counseling, educating, and coordinating care of the patient.     Howie Ill, MD [facility]

## 2018-03-08 NOTE — Telephone Encounter (Signed)
-----   Message from Purcell NailsMelody N Shambley, [place] IslandCNM sent at 03/08/2018  1:52 PM EDT ----- Please let her know her prolactin levels are still elevated. I have placed a referral to the endocrinologists as we discussed. also please et her know I reviewed the note from the consult with the surgeon and agree with his recommendations(refer to endocrinology too).

## 2018-03-09 ENCOUNTER — Telehealth: Payer: Self-pay

## 2018-03-09 NOTE — Telephone Encounter (Signed)
Message left for the patient to call back to schedule surgery.

## 2018-03-09 NOTE — Telephone Encounter (Signed)
I spoke with the patient and her husband and she would like to wait to schedule surgery until after she is seen by the Endocrinologist first. She is aware to call us back once she has made her decision.

## 2018-03-15 ENCOUNTER — Ambulatory Visit (INDEPENDENT_AMBULATORY_CARE_PROVIDER_SITE_OTHER): Payer: BLUE CROSS/BLUE SHIELD | Admitting: Obstetrics and Gynecology

## 2018-03-15 ENCOUNTER — Encounter: Payer: Self-pay | Admitting: Obstetrics and Gynecology

## 2018-03-15 VITALS — BP 111/82 | HR 58 | Ht <= 58 in | Wt 128.8 lb

## 2018-03-15 DIAGNOSIS — N643 Galactorrhea not associated with childbirth: Secondary | ICD-10-CM

## 2018-03-15 DIAGNOSIS — E229 Hyperfunction of pituitary gland, unspecified: Secondary | ICD-10-CM | POA: Diagnosis not present

## 2018-03-15 DIAGNOSIS — R7989 Other specified abnormal findings of blood chemistry: Secondary | ICD-10-CM

## 2018-03-15 NOTE — Patient Instructions (Signed)
Anlisis de la concentracin de prolactina (Prolactin Level Test) POR QU ME HAGO ESTE ANLISIS? La prolactina es una hormona producida por la hipfisis. En general, este anlisis se [place]) Islandsutiliza para diagnosticar y Chief Operating Officercontrolar las complicaciones de la hipfisis. La concentracin de prolactina aumenta y disminuye (flucta) debido a estrs, enfermedad, traumatismo o Azerbaijanciruga. El aumento de las concentraciones suele asociarse con la ausencia de la menstruacin [place](amenorrea) y con tumores. QU TIPO DE MUESTRA SE TOMA? Se requiere [place]una muestra de sangre para [place] Plymoutheste anlisis. Generalmente, se obtiene mediante la insercin de [place]una aguja en una vena. CMO ME PREPARO PARA EL ANLISIS? No se requiere una preparacin para Sonic Automotiveeste anlisis. CULES SON LOS VALORES DE REFERENCIA? Los valores de referencia son los valores saludables establecidos despus de Education officer, environmentalrealizar el anlisis a un grupo grande de personas sanas. Pueden variar American Family Insuranceentre diferentes personas, laboratorios y hospitales. Es su responsabilidad retirar el resultado del Cynthianaestudio. Consulte en el laboratorio o en el departamento en el que fue realizado el estudio cundo y cmo podr Starbucks Corporationobtener los resultados.  Hombres adultos: menos de 20ng/ml.  Mujeres adultas: menos de 28ng/ml.  Embarazadas: ? Primer trimestre: menos de 80ng/ml. ? Segundo trimestre: menos de 160ng/ml. ? Tercer trimestre: menos de 400ng/ml. QU SIGNIFICAN LOS RESULTADOS? El aumento de las concentraciones de prolactina puede indicar lo siguiente:  Tumor hipofisario.  Amenorrea.  Hipotiroidismo.  Determinados sndromes de la hipfisis o Administrator, Civil Serviceel aparato reproductor.  Insuficiencia renal. La disminucin de las concentraciones de prolactina puede indicar lo siguiente:  Falta de irrigacin sangunea a la hipfisis.  Insuficiencia hipofisaria. Hable con el mdico para Avon Productsanalizar los resultados, las opciones de tratamiento y, si es necesario, la necesidad de Education officer, environmentalrealizar ms Pomonaestudios. Hable con el  mdico si tiene Dynegyalguna pregunta sobre los resultados. Esta informacin no tiene Theme park managercomo fin reemplazar el consejo del mdico. Asegrese de hacerle al mdico cualquier pregunta que tenga. Document Released: 03/29/2013 Document Revised: 06/29/2014 Document Reviewed: 12/01/2013 Elsevier Interactive Patient Education  2018 [employer] Tumores hipofisarios (Pituitary Tumors) El tumor hipofisario es un crecimiento anormal en la hipfisis. La hipfisis es un pequeo rgano, del tamao de [place]una moneda, que est ubicado en el centro del cerebro. Produce hormonas que afectan el crecimiento y las funciones de otras glndulas del cuerpo. La mayora de los tumores hipofisarios son benignos. Esto significa que no son cancerosos. Crecen lentamente y no se extienden hacia otras partes del cuerpo. Un tumor hipofisario hace que la glndula hipfisis produzca demasiadas hormonas. Los tumores que producen hormonas se llaman tumores funcionantes (los que no producen hormonas se llaman tumores no funcionantes). Los problemas que pueden estar causados por los tumores hipofisarios incluyen:  Enfermedad de Research scientist (life sciences)[place]. Esta enfermedad provoca la acumulacin de grasa en la cara, espalda y Massachusetts Mutual Lifepecho mientras que los brazos y las piernas se mantienen delgados.  Acromegalia. Este es un trastorno en el cual las manos, los pies y la cara son ms grandes de lo normal.  Produccin de [place]leche aunque no Insurance underwriterhaya embarazo. CAUSAS La causa de la Seaviewmayora de los tumores hipofisarios es desconocida. En algunos casos, este tipo de tumores son de Greasewoodfamilia. FACTORES DE RIESGO Algunos casos de tumores hipofisarios se deben a factores genticos que una persona hereda y que aumentan la posibilidad de Environmental education officerdesarrollar ciertos tumores, entre ellos los tumores hipofisarios. SIGNOS Y SNTOMAS  Dolores de Turkmenistancabeza.  Problemas de visin.  Debilidad o falta de energa.  Observa un lquido claro que drena por la Clinical cytogeneticistnariz.  Cambios en el sentido del olfato.  Siente  ganas de vomitar (  nuseas) y vmitos.  Problemas ocasionados por la produccin de varias hormonas como: ? Infertilidad. ? Faltas en el perodo menstrual en la mujer. ? Crecimiento anormal. ? Presin arterial elevada (hipertensin). ? Intolerancia al calor o al fro. ? Otros cambios en la piel y en el cuerpo. ? Secrecin del pezn. ? Disminucin de la funcin sexual. DIAGNSTICO Si desarrolla estos sntomas, se le realizar una tomografa computada o resonancia magntica para ver si existen tumores hipofisarios. Si sabe que existen este tipo de tumores en su ncleo familiar, Manufacturing systems engineer anlisis de sangre con regularidad para controlar los niveles de las hormonas hipofisarias. TRATAMIENTO Estos tumores pueden tratarse mejor cuando se diagnostican de forma temprana. Los tratamientos pueden incluir:  Extirpacin [place] del tumor. Es el tratamiento ms frecuente.  Radioterapia. En este tratamiento, se utilizan altas dosis de rayos X para destruir las clulas tumorales.  Terapia farmacolgica. Esto implica utilizar ciertos medicamentos par bloquear la produccin de hormonas por parte de la glndula hipfisis. INSTRUCCIONES PARA EL CUIDADO EN EL HOGAR  Beba lquido en abundancia.  Controle el volumen que orina si se lo indica el profesional.  No se escarbe la Darene Lamer, ni retire las costras.  No realice ninguna actividad que requiera esfuerzo.  Tome todos los medicamentos como le indic el mdico.  Cumpla con todas las visitas de control, segn le indique su mdico. SOLICITE ATENCIN MDICA SI:  Tiene sed repentina e inusual.  Orina con frecuencia.  Siente un dolor de cabeza intenso que no se [place].  Su visin se modifica.  Observa un lquido claro proveniente de su nariz u odos, tiene una sensacin de goteo de lquido por la garganta o siente un sabor salado en la boca.  Tiene dificultad para concentrarse. SOLICITE ATENCIN MDICA DE INMEDIATO SI:  Los  sntomas empeoran repentinamente.  Tiene una hemorragia nasal que contina luego de algunos minutos.  La temperatura se eleva por encima de 101 F (38,3 C).  Siente un dolor de cabeza intenso o rigidez en el cuello.  Se siente confundido o no tan despejado como lo habitual.  Siente falta de aire o dolor en el pecho. Esta informacin no tiene Theme park manager el consejo del mdico. Asegrese de hacerle al mdico cualquier pregunta que tenga. Document Released: 09/24/2008 Document Revised: 03/29/2013 Document Reviewed: 12/09/2012 Elsevier Interactive Patient Education  [employer].

## 2018-03-15 NOTE — Progress Notes (Signed)
  Subjective:     Patient ID: Cristina Weber, female   DOB: 09/01/1985, 32 y.o.   MRN: [id number]  HPI Here for follow up on bilateral galactorrhea, saw general surgeon for consult on 03/08/18 and denies any changes. Is awaiting endocrinology consult.  Still notes bilateral milky discharge. Denies any concerns. LMP 03/11/18 and was normal.  Also still having occasional headaches- states they are throughout top of head and a pounding sensation. Typically relieved with tylenol.   Review of Systems  Neurological: Positive for headaches.  All other systems reviewed and are negative.      Objective:   Physical Exam A&Ox4 Well groomed female in no distress Blood pressure 111/82, pulse (!) 58, height 4\' 10"  (1.473 m), weight 128 lb 12.8 oz (58.4 kg), last menstrual period 03/11/2018, unknown if currently breastfeeding. Breasts: breasts appear normal, no suspicious masses, no skin or nipple changes or axillary nodes, bilateral scant milky discharged expressed from both breast..    Assessment:     Galactorrhea of both breast Elevated prolactin level Headaches     Plan:     Reassured and no more labs recommended at this time. Information given in Spanish for her to review in regards to galactorrhea, pituitary tumors and elevated prolactin levels. Will await endocrinology input.   Melody NixonShambley, CNM

## 2018-03-29 ENCOUNTER — Other Ambulatory Visit: Payer: Self-pay | Admitting: Physician Assistant

## 2018-03-29 DIAGNOSIS — H9319 Tinnitus, unspecified ear: Secondary | ICD-10-CM

## 2018-04-05 ENCOUNTER — Telehealth: Payer: Self-pay | Admitting: Obstetrics and Gynecology

## 2018-04-05 NOTE — Telephone Encounter (Signed)
Called pt was given [place] endo number they will call about appt

## 2018-04-05 NOTE — Telephone Encounter (Signed)
The patients husband called and stated that the patient had a referral put in to see a endocrinologist, The patients husband stated that they have yet to hear anything back what so ever in regards to getting the patient scheduled for a future appointment, He stated that it has been over a month and they are both concerned, They are requesting a call back. No other information was disclosed. Please advise.

## 2018-04-14 ENCOUNTER — Ambulatory Visit
Admission: RE | Admit: 2018-04-14 | Discharge: 2018-04-14 | Disposition: A | Discharge status: Home / Self Care | Payer: BLUE CROSS/BLUE SHIELD | Source: Ambulatory Visit | Attending: Physician Assistant | Admitting: Physician Assistant

## 2018-04-14 DIAGNOSIS — H9319 Tinnitus, unspecified ear: Secondary | ICD-10-CM | POA: Insufficient documentation

## 2018-04-14 MED ORDER — GADOBENATE DIMEGLUMINE 529 MG/ML IV SOLN: 5.5000 mL | Freq: Once | INTRAVENOUS | Status: DC | PRN

## 2018-04-14 MED ORDER — GADOBUTROL 1 MMOL/ML IV SOLN
5.5000 mL | Freq: Once | INTRAVENOUS | Status: AC | PRN
  Administered 2018-04-14: 5.5 mL via INTRAVENOUS

## 2018-05-05 ENCOUNTER — Ambulatory Visit (INDEPENDENT_AMBULATORY_CARE_PROVIDER_SITE_OTHER): Payer: BLUE CROSS/BLUE SHIELD | Admitting: Obstetrics and Gynecology

## 2018-05-05 ENCOUNTER — Other Ambulatory Visit: Payer: Self-pay | Admitting: Obstetrics and Gynecology

## 2018-05-05 ENCOUNTER — Encounter: Payer: Self-pay | Admitting: Obstetrics and Gynecology

## 2018-05-05 VITALS — BP 112/71 | HR 67 | Ht <= 58 in | Wt 133.0 lb

## 2018-05-05 DIAGNOSIS — N926 Irregular menstruation, unspecified: Secondary | ICD-10-CM

## 2018-05-05 DIAGNOSIS — N643 Galactorrhea not associated with childbirth: Secondary | ICD-10-CM

## 2018-05-05 NOTE — Patient Instructions (Signed)
Primer trimestre de embarazo °(First Trimester of Pregnancy) °El primer trimestre de embarazo se extiende desde la semana 1 hasta el final de la semana 12 (mes 1 al mes 3). Durante este tiempo, el bebé comenzará a desarrollarse dentro suyo. Entre la semana 6 y la 8, se forman los ojos y el rostro, y los latidos del corazón pueden verse en la ecografía. Al final de las 12 semanas, todos los órganos del bebé están formados. La atención prenatal es toda la asistencia médica que usted recibe antes del nacimiento del bebé. Asegúrese de recibir una buena atención prenatal y de seguir todas las indicaciones del médico. °CUIDADOS EN EL HOGAR °Medicamentos: °· Tome los medicamentos solamente como se lo haya indicado el médico. Algunos medicamentos se pueden tomar durante el embarazo y otros no. °· Tome las vitaminas prenatales como se lo haya indicado el médico. °· Tome el medicamento que la ayuda a defecar (laxante suave) según sea necesario, si el médico lo autoriza. °Dieta °· Ingiera alimentos saludables de manera regular. °· El médico le indicará la cantidad de peso que puede aumentar. °· No coma carne cruda ni quesos sin cocinar. °· Si tiene malestar estomacal (náuseas) o vomita: °? Ingiera 4 o 5 comidas pequeñas por día en lugar de 3 abundantes. °? Intente comer algunas galletitas saladas. °? Beba líquidos entre las comidas, en lugar de hacerlo durante estas. °· Si tiene dificultad para defecar (estreñimiento): °? Consuma alimentos con alto contenido de fibra, como verduras y frutas frescos, y cereales integrales. °? Beba suficiente líquido para mantener el pis (orina) claro o de color amarillo pálido. °Actividad y ejercicios °· Haga ejercicios solamente como se lo haya indicado el médico. Deje de hacer ejercicios si tiene cólicos o dolor en la parte baja del vientre (abdomen) o en la cintura. °· Intente no estar de pie durante mucho tiempo. Mueva las piernas con frecuencia si debe estar de pie en un lugar durante  mucho tiempo. °· Evite levantar pesos excesivos. °· Use zapatos con tacones bajos. Mantenga una buena postura al sentarse y pararse. °· Puede tener relaciones sexuales, a menos que el médico le indique lo contrario. °Alivio del dolor o las molestias °· Use un sostén que le brinde buen soporte si le duelen las mamas. °· Dese baños con agua tibia (baños de asiento) para aliviar el dolor o las molestias a causa de las hemorroides. Use crema antihemorroidal si el médico se lo permite. °· Descanse con las piernas elevadas si tiene calambres o dolor de cintura. °· Use medias de descanso si tiene las venas de las piernas hinchadas y abultadas (venas varicosas). Eleve los pies durante 15 minutos, 3 o 4 veces por día. Limite la cantidad de sal en su dieta. °Cuidados prenatales °· Programe las visitas prenatales para la semana 12 de embarazo. °· Escriba sus preguntas. Llévelas cuando concurra a las visitas prenatales. °· Concurra a todas las visitas prenatales como se lo haya indicado el médico. °Seguridad °· Colóquese el cinturón de seguridad cuando conduzca. °· Haga una lista con los números de teléfono en caso de emergencia, en la cual deben incluirse los números de los familiares, los amigos, el hospital y los departamentos de policía y de bomberos. °Consejos generales °· Pídale al médico que la derive a clases prenatales en su localidad. Debe comenzar a tomar las clases antes de entrar en el mes 6 de embarazo. °· Pida ayuda si necesita asesoramiento o asistencia con la alimentación. El médico puede aconsejarla o indicarle dónde recurrir para recibir ayuda. °· No se dé baños de inmersión en agua caliente, baños   turcos ni saunas. °· No se haga duchas vaginales ni use tampones o toallas higiénicas perfumadas. °· No mantenga las piernas cruzadas durante mucho tiempo. °· Evite el contacto con las bandejas sanitarias de los gatos y la tierra que estos animales usan. °· No fume, no consuma hierbas ni beba alcohol. No tome  fármacos que el médico no haya autorizado. °· No consuma ningún producto que contenga tabaco, lo que incluye cigarrillos, tabaco de mascar o cigarrillos electrónicos. Si necesita ayuda para dejar de fumar, consulte al médico. Puede recibir asesoramiento u otro tipo de apoyo para dejar de fumar. °· Visite al dentista. En su casa, lávese los dientes con un cepillo dental suave. Pásese el hilo dental con suavidad. °SOLICITE AYUDA SI: °· Tiene mareos. °· Tiene cólicos leves o siente presión en la parte baja del vientre. °· Siente un dolor persistente en la zona del vientre. °· Sigue teniendo malestar estomacal, vomita o las heces son líquidas (diarrea). °· Observa una secreción, con mal olor que proviene de la vagina. °· Siente dolor al orinar. °· Tiene el rostro, las manos, las piernas o los tobillos más hinchados (inflamados). ° °SOLICITE AYUDA DE INMEDIATO SI: °· Tiene fiebre. °· Tiene una pérdida de líquido por la vagina. °· Tiene sangrado o pequeñas pérdidas vaginales. °· Tiene cólicos o dolor muy intensos en el vientre. °· Sube o baja de peso rápidamente. °· Vomita sangre. Puede ser similar a la borra del café °· Está en contacto con personas que tienen rubéola, la quinta enfermedad o varicela. °· Siente un dolor de cabeza muy intenso. °· Le falta el aire. °· Sufre cualquier tipo de traumatismo, por ejemplo, debido a una caída o un accidente automovilístico. ° °Esta información no tiene como fin reemplazar el consejo del médico. Asegúrese de hacerle al médico cualquier pregunta que tenga. °Document Released: 09/04/2008 Document Revised: 06/29/2014 Document Reviewed: 04/18/2013 °Elsevier Interactive Patient Education © 2017 [employer] ° °

## 2018-05-05 NOTE — Progress Notes (Signed)
  Subjective:     Patient ID: Cristina SakaiLuisa Weber, female   DOB: 12/24/1985, 32 y.o.   MRN: [id number]  HPI Here for pregnancy confirmation with normal LMP 04/06/18 giving EDC 01/11/19 and EGA 3579w1d. H/o previous miscarriage and bilateral galactorrhea and elevated prolactin of unknown etiology.  Review of Systems  All other systems reviewed and are negative.      Objective:   Physical Exam A&Ox4 Well groomed female in no distress Vitals:   05/05/18 1456  Weight: 133 lb (60.3 kg)  Height: 4\' 9"  (1.448 m)     UPT+ Pelvic not indicated Assessment:     Missed menses    Plan:     Labs obtained  And will repeat in 3-4 days if needed.  Viability scan in 3 weeks with nurse intake.  Congratulated on pregnancy    Melody Shambley,CNM

## 2018-05-05 NOTE — Progress Notes (Signed)
Pt is here for a confirmation of pregnancy.

## 2018-05-06 ENCOUNTER — Ambulatory Visit (INDEPENDENT_AMBULATORY_CARE_PROVIDER_SITE_OTHER): Payer: BLUE CROSS/BLUE SHIELD | Admitting: Endocrinology

## 2018-05-06 ENCOUNTER — Encounter: Payer: Self-pay | Admitting: Endocrinology

## 2018-05-06 VITALS — BP 110/70 | HR 72 | Ht <= 58 in | Wt 131.2 lb

## 2018-05-06 DIAGNOSIS — R7989 Other specified abnormal findings of blood chemistry: Secondary | ICD-10-CM

## 2018-05-06 DIAGNOSIS — E229 Hyperfunction of pituitary gland, unspecified: Secondary | ICD-10-CM

## 2018-05-06 LAB — PROLACTIN: PROLACTIN: 23.7 ng/mL — AB (ref 4.8–23.3)

## 2018-05-06 LAB — BETA HCG QUANT (REF LAB): hCG Quant: 652 m[IU]/mL

## 2018-05-06 NOTE — Patient Instructions (Addendum)
During pregnancy or breast feeding, the high prolactin needs no treatment or testing unless your headaches get much worse. This also does not interfere with pregnancy or breast feeding. Please come back after you are finished breast feeding, or if the headache gets much worse.   Durante el embarazo o la lactancia, la prolactina alta no necesita tratamiento o prueba a menos que sus dolores de cabeza [employer] mucho. Esto tampoco interfiere con el embarazo o la lactancia. Regrese despus de haber terminado de Museum/gallery exhibitions officeramamantar o si el dolor de cabeza [employer]pe[employer] mucho.
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
## 2018-05-09 ENCOUNTER — Other Ambulatory Visit: Payer: BLUE CROSS/BLUE SHIELD

## 2018-05-09 DIAGNOSIS — N926 Irregular menstruation, unspecified: Secondary | ICD-10-CM

## 2018-05-10 LAB — BETA HCG QUANT (REF LAB): hCG Quant: 3183 m[IU]/mL

## 2018-05-26 ENCOUNTER — Ambulatory Visit (INDEPENDENT_AMBULATORY_CARE_PROVIDER_SITE_OTHER): Payer: BLUE CROSS/BLUE SHIELD | Admitting: Obstetrics and Gynecology

## 2018-05-26 ENCOUNTER — Ambulatory Visit (INDEPENDENT_AMBULATORY_CARE_PROVIDER_SITE_OTHER): Payer: BLUE CROSS/BLUE SHIELD

## 2018-05-26 ENCOUNTER — Other Ambulatory Visit: Payer: Self-pay | Admitting: Obstetrics and Gynecology

## 2018-05-26 VITALS — BP 106/68 | HR 57 | Ht <= 58 in | Wt 131.5 lb

## 2018-05-26 DIAGNOSIS — Z3201 Encounter for pregnancy test, result positive: Secondary | ICD-10-CM

## 2018-05-26 DIAGNOSIS — Z3A01 Less than 8 weeks gestation of pregnancy: Secondary | ICD-10-CM | POA: Diagnosis not present

## 2018-05-26 DIAGNOSIS — N8311 Corpus luteum cyst of right ovary: Secondary | ICD-10-CM

## 2018-05-26 DIAGNOSIS — Z3481 Encounter for supervision of other normal pregnancy, first trimester: Secondary | ICD-10-CM

## 2018-05-26 DIAGNOSIS — Z113 Encounter for screening for infections with a predominantly sexual mode of transmission: Secondary | ICD-10-CM

## 2018-05-26 DIAGNOSIS — O3481 Maternal care for other abnormalities of pelvic organs, first trimester: Secondary | ICD-10-CM

## 2018-05-26 DIAGNOSIS — Z0283 Encounter for blood-alcohol and blood-drug test: Secondary | ICD-10-CM

## 2018-05-26 LAB — OB RESULTS CONSOLE GC/CHLAMYDIA: Gonorrhea: NEGATIVE

## 2018-05-26 LAB — OB RESULTS CONSOLE VARICELLA ZOSTER ANTIBODY, IGG: Varicella: IMMUNE

## 2018-05-26 NOTE — Progress Notes (Signed)
      Cristina Weber presents for NOB nurse intake visit. [facility]  [facility], with Melody Shambley.  G 3.  P0010.  LMP 04/06/18 .  EDD7/21/19.  Ga 851w2d. Pregnancy education material explained and given.  0 cats in the home.  NOB labs ordered. BMI less than 30. TSH/HbgA1c no order. Sickle cell ordered due to race.  HIV and drug screen explained and ordered. Genetic screening discussed. Genetic testing; Unsure. Pt to discuss genetic testing with provider. PNV encouraged. Pt to follow up with provider in 4 weeks for NOB physical. Reviewed, signed and went over the [facility] and [facility] Financial Policy.  Pt stated that she understood.  BP 106/68   Pulse (!) 57   Ht 4\' 9"  (1.448 m)   Wt 131 lb 8 oz (59.6 kg)   LMP 04/06/2018 (Exact Date)   BMI 28.46 kg/m

## 2018-05-26 NOTE — Patient Instructions (Signed)
WHAT OB PATIENTS CAN EXPECT   Confirmation of pregnancy and ultrasound ordered if medically indicated-[redacted] weeks gestation  New OB (NOB) intake with nurse and New OB (NOB) labs- [redacted] weeks gestation  New OB (NOB) physical examination with provider- 11/[redacted] weeks gestation  Flu vaccine-[redacted] weeks gestation  Anatomy scan-[redacted] weeks gestation  Glucose tolerance test, blood work to test for anemia, T-dap vaccine-[redacted] weeks gestation  Vaginal swabs/cultures-STD/Group B strep-[redacted] weeks gestation  Appointments every 4 weeks until 28 weeks  Every 2 weeks from 28 weeks until 36 weeks  Weekly visits from 36 weeks until delivery  Morning Sickness Morning sickness is when you feel sick to your stomach (nauseous) during pregnancy. You may feel sick to your stomach and throw up (vomit). You may feel sick in the morning, but you can feel this way any time of day. Some women feel very sick to their stomach and cannot stop throwing up (hyperemesis gravidarum). Follow these instructions at home:  Only take medicines as told by your doctor.  Take multivitamins as told by your doctor. Taking multivitamins before getting pregnant can stop or lessen the harshness of morning sickness.  Eat dry toast or unsalted crackers before getting out of bed.  Eat 5 to 6 small meals a day.  Eat dry and bland foods like rice and baked potatoes.  Do not drink liquids with meals. Drink between meals.  Do not eat greasy, fatty, or spicy foods.  Have someone cook for you if the smell of food causes you to feel sick or throw up.  If you feel sick to your stomach after taking prenatal vitamins, take them at night or with a snack.  Eat protein when you need a snack (nuts, yogurt, cheese).  Eat unsweetened gelatins for dessert.  Wear a bracelet used for sea sickness (acupressure wristband).  Go to a doctor that puts thin needles into certain body points (acupuncture) to improve how you feel.  Do not smoke.  Use a  humidifier to keep the air in your house free of odors.  Get lots of fresh air. Contact a doctor if:  You need medicine to feel better.  You feel dizzy or lightheaded.  You are losing weight. Get help right away if:  You feel very sick to your stomach and cannot stop throwing up.  You pass out (faint). This information is not intended to replace advice given to you by your health care provider. Make sure you discuss any questions you have with your health care provider. Document Released: 07/16/2004 Document Revised: 11/14/2015 Document Reviewed: 11/23/2012 Elsevier Interactive Patient Education  2017 [employer]. How a Baby Grows During Pregnancy Pregnancy begins when a female's sperm enters a female's egg (fertilization). This happens in one of the tubes (fallopian tubes) that connect the ovaries to the womb (uterus). The fertilized egg is called an embryo until it reaches 10 weeks. From 10 weeks until birth, it is called a fetus. The fertilized egg moves down the fallopian tube to the uterus. Then it implants into the lining of the uterus and begins to grow. The developing fetus receives oxygen and nutrients through the pregnant woman's bloodstream and the tissues that grow (placenta) to support the fetus. The placenta is the life support system for the fetus. It provides nutrition and removes waste. Learning as much as you can about your pregnancy and how your baby is developing can help you enjoy the experience. It can also make you aware of when there might be a problem  and when to ask questions. How long does a typical pregnancy last? A pregnancy usually lasts 280 days, or about 40 weeks. Pregnancy is divided into three trimesters:  First trimester: 0-13 weeks.  Second trimester: 14-27 weeks.  Third trimester: 28-40 weeks.  The day when your baby is considered ready to be born (full term) is your estimated date of delivery. How does my baby develop month by month? First  month  The fertilized egg attaches to the inside of the uterus.  Some cells will form the placenta. Others will form the fetus.  The arms, legs, brain, spinal cord, lungs, and heart begin to develop.  At the end of the first month, the heart begins to beat.  Second month  The bones, inner ear, eyelids, hands, and feet form.  The genitals develop.  By the end of 8 weeks, all major organs are developing.  Third month  All of the internal organs are forming.  Teeth develop below the gums.  Bones and muscles begin to grow. The spine can flex.  The skin is transparent.  Fingernails and toenails begin to form.  Arms and legs continue to grow longer, and hands and feet develop.  The fetus is about 3 in (7.6 cm) long.  Fourth month  The placenta is completely formed.  The external sex organs, neck, outer ear, eyebrows, eyelids, and fingernails are formed.  The fetus can hear, swallow, and move its arms and legs.  The kidneys begin to produce urine.  The skin is covered with a white waxy coating (vernix) and very fine hair (lanugo).  Fifth month  The fetus moves around more and can be felt for the first time (quickening).  The fetus starts to sleep and wake up and may begin to suck its finger.  The nails grow to the end of the fingers.  The organ in the digestive system that makes bile (gallbladder) functions and helps to digest the nutrients.  If your baby is a girl, eggs are present in her ovaries. If your baby is a boy, testicles start to move down into his scrotum.  Sixth month  The lungs are formed, but the fetus is not yet able to breathe.  The eyes open. The brain continues to develop.  Your baby has fingerprints and toe prints. Your baby's hair grows thicker.  At the end of the second trimester, the fetus is about 9 in (22.9 cm) long.  Seventh month  The fetus kicks and stretches.  The eyes are developed enough to sense changes in light.  The  hands can make a grasping motion.  The fetus responds to sound.  Eighth month  All organs and body systems are fully developed and functioning.  Bones harden and taste buds develop. The fetus may hiccup.  Certain areas of the brain are still developing. The skull remains soft.  Ninth month  The fetus gains about  lb (0.23 kg) each week.  The lungs are fully developed.  Patterns of sleep develop.  The fetus's head typically moves into a head-down position (vertex) in the uterus to prepare for birth. If the buttocks move into a vertex position instead, the baby is breech.  The fetus weighs 6-9 lbs (2.72-4.08 kg) and is 19-20 in (48.26-50.8 cm) long.  What can I do to have a healthy pregnancy and help my baby develop? Eating and Drinking  Eat a healthy diet. ? Talk with your health care provider to make sure that you are getting the  nutrients that you and your baby need. ? Visit www.BuildDNA.es to learn about creating a healthy diet.  Gain a healthy amount of weight during pregnancy as advised by your health care provider. This is usually 25-35 pounds. You may need to: ? Gain more if you were underweight before getting pregnant or if you are pregnant with more than one baby. ? Gain less if you were overweight or obese when you got pregnant.  Medicines and Vitamins  Take prenatal vitamins as directed by your health care provider. These include vitamins such as folic acid, iron, calcium, and vitamin D. They are important for healthy development.  Take medicines only as directed by your health care provider. Read labels and ask a pharmacist or your health care provider whether over-the-counter medicines, supplements, and prescription drugs are safe to take during pregnancy.  Activities  Be physically active as advised by your health care provider. Ask your health care provider to recommend activities that are safe for you to do, such as walking or swimming.  Do not  participate in strenuous or extreme sports.  Lifestyle  Do not drink alcohol.  Do not use any tobacco products, including cigarettes, chewing tobacco, or electronic cigarettes. If you need help quitting, ask your health care provider.  Do not use illegal drugs.  Safety  Avoid exposure to mercury, lead, or other heavy metals. Ask your health care provider about common sources of these heavy metals.  Avoid listeria infection during pregnancy. Follow these precautions: ? Do not eat soft cheeses or deli meats. ? Do not eat hot dogs unless they have been warmed up to the point of steaming, such as in the microwave oven. ? Do not drink unpasteurized milk.  Avoid toxoplasmosis infection during pregnancy. Follow these precautions: ? Do not change your cat's litter box, if you have a cat. Ask someone else to do this for you. ? Wear gardening gloves while working in the yard.  General Instructions  Keep all follow-up visits as directed by your health care provider. This is important. This includes prenatal care and screening tests.  Manage any chronic health conditions. Work closely with your health care provider to keep conditions, such as diabetes, under control.  How do I know if my baby is developing well? At each prenatal visit, your health care provider will do several different tests to check on your health and keep track of your baby's development. These include:  Fundal height. ? Your health care provider will measure your growing belly from top to bottom using a tape measure. ? Your health care provider will also feel your belly to determine your baby's position.  Heartbeat. ? An ultrasound in the first trimester can confirm pregnancy and show a heartbeat, depending on how far along you are. ? Your health care provider will check your baby's heart rate at every prenatal visit. ? As you get closer to your delivery date, you may have regular fetal heart rate monitoring to make  sure that your baby is not in distress.  Second trimester ultrasound. ? This ultrasound checks your baby's development. It also indicates your baby's gender.  What should I do if I have concerns about my baby's development? Always talk with your health care provider about any concerns that you may have. This information is not intended to replace advice given to you by your health care provider. Make sure you discuss any questions you have with your health care provider. Document Released: 11/25/2007 Document Revised: 11/14/2015 Document Reviewed:  11/15/2013 Elsevier Interactive Patient Education  2018 [place] Trimester of Pregnancy The first trimester of pregnancy is from week 1 until the end of week 13 (months 1 through 3). During this time, your baby will begin to develop inside you. At 6-8 weeks, the eyes and face are formed, and the heartbeat can be seen on ultrasound. At the end of 12 weeks, all the baby's organs are formed. Prenatal care is all the medical care you receive before the birth of your baby. Make sure you get good prenatal care and follow all of your doctor's instructions. Follow these instructions at home: Medicines  Take over-the-counter and prescription medicines only as told by your doctor. Some medicines are safe and some medicines are not safe during pregnancy.  Take a prenatal vitamin that contains at least 600 micrograms (mcg) of folic acid.  If you have trouble pooping (constipation), take medicine that will make your stool soft (stool softener) if your doctor approves. Eating and drinking  Eat regular, healthy meals.  Your doctor will tell you the amount of weight gain that is right for you.  Avoid raw meat and uncooked cheese.  If you feel sick to your stomach (nauseous) or throw up (vomit): ? Eat 4 or 5 small meals a day instead of 3 large meals. ? Try eating a few soda crackers. ? Drink liquids between meals instead of during meals.  To  prevent constipation: ? Eat foods that are high in fiber, like fresh fruits and vegetables, whole grains, and beans. ? Drink enough fluids to keep your pee (urine) clear or pale yellow. Activity  Exercise only as told by your doctor. Stop exercising if you have cramps or pain in your lower belly (abdomen) or low back.  Do not exercise if it is too hot, too humid, or if you are in a place of great height (high altitude).  Try to avoid standing for long periods of time. Move your legs often if you must stand in one place for a long time.  Avoid heavy lifting.  Wear low-heeled shoes. Sit and stand up straight.  You can have sex unless your doctor tells you not to. Relieving pain and discomfort  Wear a good support bra if your breasts are sore.  Take warm water baths (sitz baths) to soothe pain or discomfort caused by hemorrhoids. Use hemorrhoid cream if your doctor says it is okay.  Rest with your legs raised if you have leg cramps or low back pain.  If you have puffy, bulging veins (varicose veins) in your legs: ? Wear support hose or compression stockings as told by your doctor. ? Raise (elevate) your feet for 15 minutes, 3-4 times a day. ? Limit salt in your food. Prenatal care  Schedule your prenatal visits by the twelfth week of pregnancy.  Write down your questions. Take them to your prenatal visits.  Keep all your prenatal visits as told by your doctor. This is important. Safety  Wear your seat belt at all times when driving.  Make a list of emergency phone numbers. The list should include numbers for family, friends, the hospital, and police and fire departments. General instructions  Ask your doctor for a referral to a local prenatal class. Begin classes no later than at the start of month 6 of your pregnancy.  Ask for help if you need counseling or if you need help with nutrition. Your doctor can give you advice or tell you where to go for help.  Do not use hot  tubs, steam rooms, or saunas.  Do not douche or use tampons or scented sanitary pads.  Do not cross your legs for long periods of time.  Avoid all herbs and alcohol. Avoid drugs that are not approved by your doctor.  Do not use any tobacco products, including cigarettes, chewing tobacco, and electronic cigarettes. If you need help quitting, ask your doctor. You may get counseling or other support to help you quit.  Avoid cat litter boxes and soil used by cats. These carry germs that can cause birth defects in the baby and can cause a loss of your baby (miscarriage) or stillbirth.  Visit your dentist. At home, brush your teeth with a soft toothbrush. Be gentle when you floss. Contact a doctor if:  You are dizzy.  You have mild cramps or pressure in your lower belly.  You have a nagging pain in your belly area.  You continue to feel sick to your stomach, you throw up, or you have watery poop (diarrhea).  You have a bad smelling fluid coming from your vagina.  You have pain when you pee (urinate).  You have increased puffiness (swelling) in your face, hands, legs, or ankles. Get help right away if:  You have a fever.  You are leaking fluid from your vagina.  You have spotting or bleeding from your vagina.  You have very bad belly cramping or pain.  You gain or lose weight rapidly.  You throw up blood. It may look like coffee grounds.  You are around people who have [place] measles, fifth disease, or chickenpox.  You have a very bad headache.  You have shortness of breath.  You have any kind of trauma, such as from a fall or a car accident. Summary  The first trimester of pregnancy is from week 1 until the end of week 13 (months 1 through 3).  To take care of yourself and your unborn baby, you will need to eat healthy meals, take medicines only if your doctor tells you to do so, and do activities that are safe for you and your baby.  Keep all follow-up visits as told  by your doctor. This is important as your doctor will have to ensure that your baby is healthy and growing well. This information is not intended to replace advice given to you by your health care provider. Make sure you discuss any questions you have with your health care provider. Document Released: 11/25/2007 Document Revised: 06/16/2016 Document Reviewed: 06/16/2016 Elsevier Interactive Patient Education  2017 [place]. Commonly Asked Questions During Pregnancy  Cats: A parasite can be excreted in cat feces.  To avoid exposure you need to have another person empty the little box.  If you must empty the litter box you will need to wear gloves.  Wash your hands after handling your cat.  This parasite can also be found in raw or undercooked meat so this should also be avoided.  Colds, Sore Throats, Flu: Please check your medication sheet to see what you can take for symptoms.  If your symptoms are unrelieved by these medications please call the office.  Dental Work: Most any dental work Investment banker, corporate recommends is permitted.  X-rays should only be taken during the first trimester if absolutely necessary.  Your abdomen should be shielded with a lead apron during all x-rays.  Please notify your provider prior to receiving any x-rays.  Novocaine is fine; gas is not recommended.  If your dentist  requires a note from [place] prior to dental work please call the office and we will provide one for you.  Exercise: Exercise is an important part of staying healthy during your pregnancy.  You may continue most exercises you were accustomed to prior to pregnancy.  Later in your pregnancy you will most likely notice you have difficulty with activities requiring balance like riding a bicycle.  It is important that you listen to your body and avoid activities that put you at a higher risk of falling.  Adequate rest and staying well hydrated are a must!  If you have questions about the safety of specific activities ask  your provider.    Exposure to Children with illness: Try to avoid obvious exposure; report any symptoms to [place] when noted,  If you have chicken pos, red measles or mumps, you should be immune to these diseases.   Please do not take any vaccines while pregnant unless you have checked with your OB provider.  Fetal Movement: After 28 weeks we recommend you do "kick counts" twice daily.  Lie or sit down in a calm quiet environment and count your baby movements "kicks".  You should feel your baby at least 10 times per hour.  If you have not felt 10 kicks within the first hour get up, walk around and have something sweet to eat or drink then repeat for an additional hour.  If count remains less than 10 per hour notify your provider.  Fumigating: Follow your pest control agent's advice as to how long to stay out of your home.  Ventilate the area well before re-entering.  Hemorrhoids:   Most over-the-counter preparations can be used during pregnancy.  Check your medication to see what is safe to use.  It is important to use a stool softener or fiber in your diet and to drink lots of liquids.  If hemorrhoids seem to be getting worse please call the office.   Hot Tubs:  Hot tubs Jacuzzis and saunas are not recommended while pregnant.  These increase your internal body temperature and should be avoided.  Intercourse:  Sexual intercourse is safe during pregnancy as long as you are comfortable, unless otherwise advised by your provider.  Spotting may occur after intercourse; report any bright red bleeding that is heavier than spotting.  Labor:  If you know that you are in labor, please go to the hospital.  If you are unsure, please call the office and let us help you decide what to do.  Lifting, straining, etc:  If your job requires heavy lifting or straining please check with your provider for any limitations.  Generally, you should not lift items heavier than that you can lift simply with your hands and arms (no  back muscles)  Painting:  Paint fumes do not harm your pregnancy, but may make you ill and should be avoided if possible.  Latex or water based paints have less odor than oils.  Use adequate ventilation while painting.  Permanents & Hair Color:  Chemicals in hair dyes are not recommended as they cause increase hair dryness which can increase hair loss during pregnancy.  " Highlighting" and permanents are allowed.  Dye may be absorbed differently and permanents may not hold as well during pregnancy.  Sunbathing:  Use a sunscreen, as skin burns easily during pregnancy.  Drink plenty of fluids; avoid over heating.  Tanning Beds:  Because their possible side effects are still unknown, tanning beds are not recommended.  Ultrasound Scans:  Routine ultrasounds are performed at approximately 20 weeks.  You will be able to see your baby's general anatomy an if you would like to know the gender this can usually be determined as well.  If it is questionable when you conceived you may also receive an ultrasound early in your pregnancy for dating purposes.  Otherwise ultrasound exams are not routinely performed unless there is a medical necessity.  Although you can request a scan we ask that you pay for it when conducted because insurance does not cover " patient request" scans.  Work: If your pregnancy proceeds without complications you may work until your due date, unless your physician or employer advises otherwise.  Round Ligament Pain/Pelvic Discomfort:  Sharp, shooting pains not associated with bleeding are fairly common, usually occurring in the second trimester of pregnancy.  They tend to be worse when standing up or when you remain standing for long periods of time.  These are the result of pressure of certain pelvic ligaments called "round ligaments".  Rest, Tylenol and heat seem to be the most effective relief.  As the womb and fetus grow, they rise out of the pelvis and the discomfort improves.  Please  notify the office if your pain seems different than that described.  It may represent a more serious condition.  Common Medications Safe in Pregnancy  Acne:      Constipation:  Benzoyl Peroxide     Colace  Clindamycin      Dulcolax Suppository  Topica Erythromycin     Fibercon  Salicylic Acid      Metamucil         Miralax AVOID:        Senakot   Accutane    Cough:  Retin-A       Cough Drops  Tetracycline      Phenergan w/ Codeine if Rx  Minocycline      Robitussin (Plain & DM)  Antibiotics:     Crabs/Lice:  Ceclor       RID  Cephalosporins    AVOID:  E-Mycins      Kwell  Keflex  Macrobid/Macrodantin   Diarrhea:  Penicillin      Kao-Pectate  Zithromax      Imodium AD         PUSH FLUIDS AVOID:       Cipro     Fever:  Tetracycline      Tylenol (Regular or Extra  Minocycline       Strength)  Levaquin      Extra Strength-Do not          Exceed 8 tabs/24 hrs Caffeine:        <217m/day (equiv. To 1 cup of coffee or  approx. 3 12 oz sodas)         Gas: Cold/Hayfever:       Gas-X  Benadryl      Mylicon  Claritin       Phazyme  **Claritin-D        Chlor-Trimeton    Headaches:  Dimetapp      ASA-Free Excedrin  Drixoral-Non-Drowsy     Cold Compress  Mucinex (Guaifenasin)     Tylenol (Regular or Extra  Sudafed/Sudafed-12 Hour     Strength)  **Sudafed PE Pseudoephedrine   Tylenol Cold & Sinus     Vicks Vapor Rub  Zyrtec  **AVOID if Problems With Blood Pressure         Heartburn: Avoid lying down for at least 1 hour  after meals  Aciphex      Maalox     Rash:  Milk of Magnesia     Benadryl    Mylanta       1% Hydrocortisone Cream  Pepcid  Pepcid Complete   Sleep Aids:  Prevacid      Ambien   Prilosec       Benadryl  Rolaids       Chamomile Tea  Tums (Limit 4/day)     Unisom  Zantac       Tylenol PM         Warm milk-add vanilla or  Hemorrhoids:       Sugar for taste  Anusol/Anusol H.C.  (RX: Analapram 2.5%)  Sugar Substitutes:  Hydrocortisone OTC     Ok in  moderation  Preparation H      Tucks        Vaseline lotion applied to tissue with wiping    Herpes:     Throat:  Acyclovir      Oragel  Famvir  Valtrex     Vaccines:         Flu Shot Leg Cramps:       *Gardasil  Benadryl      Hepatitis A         Hepatitis B Nasal Spray:       Pneumovax  Saline Nasal Spray     Polio Booster         Tetanus Nausea:       Tuberculosis test or PPD  Vitamin B6 25 mg TID   AVOID:    Dramamine      *Gardasil  Emetrol       Live Poliovirus  Ginger Root 250 mg QID    MMR (measles, mumps &  High Complex Carbs @ Bedtime    rebella)  Sea Bands-Accupressure    Varicella (Chickenpox)  Unisom 1/2 tab TID     *No known complications           If received before Pain:         Known pregnancy;   Darvocet       Resume series after  Lortab        Delivery  Percocet    Yeast:   Tramadol      Femstat  Tylenol 3      Gyne-lotrimin  Ultram       Monistat  Vicodin           MISC:         All Sunscreens           Hair Coloring/highlights          Insect Repellant's          (Including DEET)         [place] [facility]

## 2018-05-27 LAB — URINALYSIS, ROUTINE W REFLEX MICROSCOPIC
Bilirubin, UA: NEGATIVE
GLUCOSE, UA: NEGATIVE
KETONES UA: NEGATIVE
Leukocytes, UA: NEGATIVE
NITRITE UA: NEGATIVE
Protein, UA: NEGATIVE
RBC UA: NEGATIVE
Specific Gravity, UA: >=1.03 — AB (ref 1.005–1.030)
UUROB: 0.2 mg/dL (ref 0.2–1.0)
pH, UA: 6.5 (ref 5.0–7.5)

## 2018-05-27 LAB — HIV ANTIBODY (ROUTINE TESTING W REFLEX): HIV Screen 4th Generation wRfx: NONREACTIVE

## 2018-05-27 LAB — DRUG PROFILE, UR, 9 DRUGS (LABCORP)
AMPHETAMINES, URINE: NEGATIVE ng/mL
Barbiturate Quant, Ur: NEGATIVE ng/mL
Benzodiazepine Quant, Ur: NEGATIVE ng/mL
CANNABINOID QUANT UR: NEGATIVE ng/mL
Cocaine (Metab.): NEGATIVE ng/mL
METHADONE SCREEN, URINE: NEGATIVE ng/mL
Opiate Quant, Ur: NEGATIVE ng/mL
PCP QUANT UR: NEGATIVE ng/mL
PROPOXYPHENE: NEGATIVE ng/mL

## 2018-05-27 LAB — NICOTINE SCREEN, URINE: COTININE UR QL SCN: NEGATIVE ng/mL

## 2018-05-27 LAB — RUBELLA SCREEN: RUBELLA: 14 {index} (ref >0.99)

## 2018-05-27 LAB — RPR: RPR Ser Ql: NONREACTIVE

## 2018-05-27 LAB — HEPATITIS B SURFACE ANTIGEN: Hepatitis B Surface Ag: NEGATIVE

## 2018-05-27 LAB — VARICELLA ZOSTER ANTIBODY, IGG: VARICELLA: 1990 {index} (ref >165)

## 2018-05-27 LAB — ABO AND RH: Rh Factor: POSITIVE

## 2018-05-27 LAB — HGB SOLU + RFLX FRAC: SICKLE SOLUBILITY TEST - HGBRFX: NEGATIVE

## 2018-05-27 LAB — ANTIBODY SCREEN: ANTIBODY SCREEN: NEGATIVE

## 2018-05-28 LAB — GC/CHLAMYDIA PROBE AMP
CHLAMYDIA, DNA PROBE: NEGATIVE
NEISSERIA GONORRHOEAE BY PCR: NEGATIVE

## 2018-05-28 LAB — URINE CULTURE, OB REFLEX: ORGANISM ID, BACTERIA: NO GROWTH

## 2018-05-28 LAB — CULTURE, OB URINE

## 2018-06-01 ENCOUNTER — Ambulatory Visit: Payer: BLUE CROSS/BLUE SHIELD | Admitting: Surgery

## 2018-06-22 NOTE — L&D Delivery Note (Addendum)
Delivery Note  1815 In room to see patient, reports pelvic pressure and the urge to push with contractions. Effective coached maternal pushing efforts. Variable decelerations noted on EFM while pushing with contractions. Patient reposition and pushing resumed every other or third contraction with steady progress.   1919 Dr Marcelline Mates notified of fetal heart rate deceleration to the 60s and decision for vacuum assisted birth.   Verbal consent obtain from patient and spouse for use of vacuum. Vacuum was placed at +3 station.   Vacuum assisted birth of liveborn female patient after two (2) pulls without complications in right occiput anterior position at 1924. Loose nuchal cord x 1 reduced over intact perineum. Infant immediately to maternal abdomen. Delayed cord clamping. Skin to skin, three (3) vessel cord, and cord blood collected. APGARs: 8, 9. Weight pending. Receiving nurse present at bedside for birth. NNP aware and readily available for newborn assessment if needed.   IV Pitocin infusing. Large gush of blood noted. Spontaneous delivery of intact placenta at 1929. Bilateral labial and right vaginal laceration repaired under epidural anesthesia using 3-0 vicryl rapide. Lacerations well approximated and hemostatic. Uterus firm. Rubra small. Vault check completed. Counts correct x 2. QBL: 730 ml.   Initiate routine postpartum care and orders. Mom to postpartum.  Baby to Couplet care / Skin to Skin.  FOB present at bedside and overjoyed with the birth of "Cristina Weber".    Diona Fanti, CNM [facility], [facility] 01/14/2019, 8:16 PM

## 2018-06-28 ENCOUNTER — Other Ambulatory Visit (HOSPITAL_COMMUNITY)
Admission: RE | Admit: 2018-06-28 | Discharge: 2018-06-28 | Disposition: A | Discharge status: Home / Self Care | Payer: Medicaid Other | Source: Ambulatory Visit | Attending: Obstetrics and Gynecology | Admitting: Obstetrics and Gynecology

## 2018-06-28 ENCOUNTER — Ambulatory Visit: Payer: Self-pay | Admitting: Obstetrics and Gynecology

## 2018-06-28 ENCOUNTER — Encounter: Payer: Self-pay | Admitting: Obstetrics and Gynecology

## 2018-06-28 VITALS — BP 104/67 | HR 61 | Wt 131.9 lb

## 2018-06-28 DIAGNOSIS — Z23 Encounter for immunization: Secondary | ICD-10-CM

## 2018-06-28 DIAGNOSIS — Z3492 Encounter for supervision of normal pregnancy, unspecified, second trimester: Secondary | ICD-10-CM

## 2018-06-28 DIAGNOSIS — Z3A12 12 weeks gestation of pregnancy: Secondary | ICD-10-CM

## 2018-06-28 NOTE — Patient Instructions (Signed)
Segundo trimestre de embarazo  Second Trimester of Pregnancy  El segundo trimestre va desde la semana14 hasta la 27, desde el cuarto hasta el sexto mes, y suele ser el momento en el que mejor se siente. Su organismo se ha adaptado a estar embarazada, y comienza a sentirse fsicamente mejor. En general, las nuseas matutinas han disminuido o han desaparecido completamente, puede tener ms energa y un aumento de apetito. El segundo trimestre es tambin la poca en la que el feto se desarrolla rpidamente. Hacia el final del sexto mes, el feto mide aproximadamente 9pulgadas (23cm) y pesa alrededor de 1 libras (700g). Es probable que sienta que el beb se mueve (da pataditas) entre las 16 y 20semanas del embarazo.  Cambios en el cuerpo durante el segundo trimestre  Su cuerpo continua experimentando numerosos cambios durante su segundo trimestre. Estos cambios varan de una mujer a otra.   Seguir aumentando de peso. Notar que la parte baja del abdomen sobresale.   Podrn aparecer las primeras estras en las caderas, el abdomen y las mamas.   Es posible que tenga dolores de cabeza que pueden aliviarse con ciertos medicamentos. Los medicamentos que tome deben estar aprobados por el mdico.   Tal vez tenga necesidad de orinar con ms frecuencia porque el feto est ejerciendo presin sobre la vejiga.   Debido al embarazo podr sentir acidez estomacal con frecuencia.   Puede estar estreida, ya que ciertas hormonas enlentecen los movimientos de los msculos que empujan los desechos a travs de los intestinos.   Pueden aparecer hemorroides o abultarse e hincharse las venas (venas varicosas).   Puede sentir dolor en la espalda. Esto se debe a:  ? Aumento de peso.  ? Las hormonas del embarazo relajan las articulaciones en la pelvis.  ? Un cambio en el peso y los msculos que ayudan a mantener su equilibrio.   Sus pechos seguirn creciendo y se pondrn cada vez ms sensibles.   Las encas pueden sangrar y estar  sensibles al cepillado y al hilo dental.   Pueden aparecer zonas oscuras o manchas (cloasma, mscara del embarazo) en el rostro. Esto probablemente se atenuar despus del nacimiento del beb.   Es posible que se forme una lnea oscura desde el ombligo hasta la zona del pubis (linea nigra). Esto probablemente se atenuar despus del nacimiento del beb.   Tal vez haya cambios en el cabello. Esto cambios pueden incluir su engrosamiento, crecimiento rpido y cambios en la textura. Adems, a algunas mujeres se les cae el cabello durante o despus del embarazo, o tienen el cabello seco o fino. Lo ms probable es que el cabello se le normalice despus del nacimiento del beb.  Qu debe esperar en las visitas prenatales  Durante una visita prenatal de rutina:   La pesarn para asegurarse de que usted y el feto estn creciendo normalmente.   Le tomarn la presin arterial.   Le medirn el abdomen para controlar el desarrollo del beb.   Se escucharn los latidos cardacos fetales.   Se evaluarn los resultados de los estudios solicitados en visitas anteriores.  El mdico puede preguntarle lo siguiente:   Cmo se siente.   Si siente los movimientos del beb.   Si ha tenido sntomas anormales, como prdida de lquido, sangrado, dolores de cabeza intensos o clicos abdominales.   Si est consumiendo algn producto que contenga tabaco, como cigarrillos, tabaco de mascar y cigarrillos electrnicos.   Si tiene alguna pregunta.  Otros estudios que podrn realizarse durante el   segundo trimestre incluyen lo siguiente:   Anlisis de sangre para detectar lo siguiente:  ? Concentraciones de hierro bajas (anemia).  ? Nivel alto de azcar en la sangre que afecta a las mujeres embarazadas (diabetes gestacional) entre las semanas 24 y 28.  ? Anticuerpos Rh. Esto es para detectar una protena en los glbulos rojos (factor Rh).   Anlisis de orina para detectar infecciones, diabetes o protenas en la orina.   Una ecografa  para confirmar que el beb crece y se desarrolla correctamente.   Una amniocentesis para diagnosticar posibles problemas genticos.   Estudios del feto para descartar espina bfida y sndrome de Down.   Prueba del VIH (virus de inmunodeficiencia humana). Los exmenes prenatales de rutina incluyen la prueba de deteccin del VIH, a menos que decida no realizrsela.  Siga estas indicaciones en su casa:  Medicamentos   Siga las indicaciones del mdico en relacin con el uso de medicamentos. Durante el embarazo, hay medicamentos que pueden tomarse y otros que no.   Tome vitaminas prenatales que contengan por lo menos 600microgramos (?g) de cido flico.   Si est estreida, tome un laxante suave, si el mdico lo autoriza.  Qu debe comer y beber     Lleve una dieta equilibrada que incluya gran cantidad de frutas y verduras frescas, cereales integrales, buenas fuentes de protenas como carnes magras, huevos o tofu, y lcteos descremados. El mdico la ayudar a determinar la cantidad de peso que puede aumentar.   No coma carne cruda ni quesos sin cocinar. Estos elementos contienen grmenes que pueden causar defectos congnitos en el beb.   Si no consume muchos alimentos con calcio, hable con su mdico sobre si debera tomar un suplemento diario de calcio.   Limite el consumo de alimentos con alto contenido de grasas y azcares procesados, como alimentos fritos o dulces.   Para evitar el estreimiento:  ? Bebe suficiente lquido para mantener la orina clara o de color amarillo plido.  ? Consuma alimentos ricos en fibra, como frutas y verduras frescas, cereales integrales y frijoles.  Actividad   Haga ejercicio solamente como se lo haya indicado el mdico. La mayora de las mujeres pueden continuar su rutina de ejercicios durante el embarazo. Intente realizar como mnimo 30minutos de actividad fsica por lo menos 5das a la semana. Deje de hacer ejercicio si experimenta contracciones uterinas.   No levante  objetos pesados, use zapatos de tacones bajos y mantenga una buena postura.   Puede seguir manteniendo relaciones sexuales, a menos que el mdico le indique lo contrario.  Alivio del dolor y del malestar   Use un sostn que le brinde buen soporte para prevenir las molestias causadas por la sensibilidad en los pechos.   Dese baos de asiento con agua tibia para aliviar el dolor o las molestias causadas por las hemorroides. Use una crema para las hemorroides si el mdico la autoriza.   Descanse con las piernas elevadas si tiene calambres o dolor de cintura.   Si tiene venas varicosas, use medias de descanso. Eleve los pies durante 15minutos, 3 o 4veces por da. Limite el consumo de sal en su dieta.  Cuidados prenatales   Escriba sus preguntas. Llvelas cuando concurra a las visitas prenatales.   Concurra a todas las visitas prenatales tal como se lo haya indicado el mdico. Esto es importante.  Seguridad   Use el cinturn de seguridad en todo momento mientras conduce.   Haga una lista de los nmeros de telfono de emergencia, que   incluya los nmeros de telfono de familiares, amigos, el hospital y los departamentos de polica y bomberos.  Instrucciones generales   Pdale al mdico que la derive a clases de educacin prenatal en su localidad. Debe comenzar a tomar las clases antes de que empiece el mes6 de embarazo.   Pida ayuda si tiene necesidades nutricionales o de asesoramiento durante el embarazo. El mdico puede aconsejarla o derivarla a especialistas para que la ayuden con diferentes necesidades.   No se d baos de inmersin en agua caliente, baos turcos ni saunas.   No se haga duchas vaginales ni use tampones o toallas higinicas perfumadas.   No mantenga las piernas cruzadas durante mucho tiempo.   Evite el contacto con las bandejas sanitarias de los gatos y la tierra que estos animales usan. Estos elementos contienen bacterias que pueden causar defectos congnitos al beb y la posible  prdida del feto debido a un aborto espontneo o muerte fetal.   Evite fumar, consumir hierbas, beber alcohol y tomar frmacos que no le hayan recetado. Las sustancias qumicas que estos productos contienen pueden afectar la formacin y el desarrollo del beb.   No consuma ningn producto que contenga nicotina o tabaco, como cigarrillos y cigarrillos electrnicos. Si necesita ayuda para dejar de fumar, consulte al mdico.   Visite a su dentista si an no lo ha hecho durante el embarazo. Use un cepillo de dientes blando para higienizarse los dientes y psese el hilo dental con suavidad.  Comunquese con un mdico si:   Tiene mareos.   Siente clicos leves, presin en la pelvis o dolor persistente en el abdomen.   Tiene nuseas, vmitos o diarrea persistentes.   Observa una secrecin vaginal con mal olor.   Siente dolor al orinar.  Solicite ayuda de inmediato si:   Tiene fiebre.   Tiene una prdida de lquido por la vagina.   Tiene sangrado o pequeas prdidas vaginales.   Siente dolor intenso o clicos en el abdomen.   Sube de peso o baja de peso rpidamente.   Tiene dificultad para respirar y siente dolor de pecho.   Sbitamente se le hinchan mucho el rostro, las manos, los tobillos, los pies o las piernas.   No ha sentido los movimientos del beb durante una hora.   Siente un dolor de cabeza intenso que no se alivia al tomar medicamentos.   Nota cambios en la visin.  Resumen   El segundo trimestre va desde la semana14 hasta la 27, desde el cuarto hasta el sexto mes. Es tambin una poca en la que el feto se desarrolla rpidamente.   Su organismo atraviesa por muchos cambios durante el embarazo. Estos cambios varan de una mujer a otra.   Evite fumar, consumir hierbas, beber alcohol y tomar frmacos que no le hayan recetado. Estas sustancias qumicas afectan la formacin y el desarrollo de su beb.   No consuma ningn producto que contenga tabaco, lo que incluye cigarrillos, tabaco de mascar  y cigarrillos electrnicos. Si necesita ayuda para dejar de fumar, consulte al mdico.   Comunquese con su mdico si tiene preguntas sobre esto. Concurra a todas las visitas prenatales tal como se lo haya indicado el mdico. Esto es importante.  Esta informacin no tiene como fin reemplazar el consejo del mdico. Asegrese de hacerle al mdico cualquier pregunta que tenga.  Document Released: 03/18/2005 Document Revised: 10/19/2016 Document Reviewed: 10/19/2016  Elsevier Interactive Patient Education  2019 [employer]

## 2018-06-28 NOTE — Addendum Note (Signed)
Addended by: Rosine Beat L on: 06/28/2018 02:22 PM   Modules accepted: Orders

## 2018-06-28 NOTE — Progress Notes (Signed)
NOB PE- pt is doing well, genetic testing ordered-[place]
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
## 2018-06-29 ENCOUNTER — Encounter: Payer: Self-pay | Admitting: *Deleted

## 2018-06-29 LAB — CBC
HEMATOCRIT: 41.2 % (ref 34.0–46.6)
Hemoglobin: 13.8 g/dL (ref 11.1–15.9)
MCH: 30.5 pg (ref 26.6–33.0)
MCHC: 33.5 g/dL (ref 31.5–35.7)
MCV: 91 fL (ref 79–97)
PLATELETS: 267 10*3/uL (ref 150–450)
RBC: 4.52 x10E6/uL (ref 3.77–5.28)
RDW: 12.6 % (ref 11.7–15.4)
WBC: 8.7 10*3/uL (ref 3.4–10.8)

## 2018-06-30 LAB — CYTOLOGY - PAP
Diagnosis: NEGATIVE
HPV: NOT DETECTED

## 2018-07-05 ENCOUNTER — Encounter: Payer: Self-pay | Admitting: *Deleted

## 2018-07-26 ENCOUNTER — Encounter: Payer: Self-pay | Admitting: Certified Nurse Midwife

## 2018-07-28 ENCOUNTER — Ambulatory Visit (INDEPENDENT_AMBULATORY_CARE_PROVIDER_SITE_OTHER): Payer: Medicaid Other | Admitting: Certified Nurse Midwife

## 2018-07-28 VITALS — BP 111/52 | HR 65 | Wt 134.1 lb

## 2018-07-28 DIAGNOSIS — Z3482 Encounter for supervision of other normal pregnancy, second trimester: Secondary | ICD-10-CM

## 2018-07-28 LAB — POCT URINALYSIS DIPSTICK OB
BILIRUBIN UA: NEGATIVE
GLUCOSE, UA: NEGATIVE
KETONES UA: NEGATIVE
Leukocytes, UA: NEGATIVE
Nitrite, UA: NEGATIVE
POC,PROTEIN,UA: NEGATIVE
RBC UA: NEGATIVE
Urobilinogen, UA: 0.2 E.U./dL
pH, UA: 6.5 (ref 5.0–8.0)

## 2018-07-28 NOTE — Progress Notes (Signed)
ROB-Reports intermittent round ligament pain. Discussed home treatment measures including use of abdominal support; handout given. Anticipatory guidance regarding course of prenatal care. Reviewed red flag symptoms and when to call. RTC x 4 weeks for ANATOMY SCAN and ROB or sooner if needed.

## 2018-07-28 NOTE — Patient Instructions (Addendum)
WE WOULD LOVE TO HEAR FROM YOU!!!!   Thank you [name] for visiting [facility].  Providing our patients with the best experience possible is really important to [place], and we hope that you felt that on your recent visit. The most valuable feedback we get comes from YOU!!    If you receive a survey please take a couple of minutes to let us know how we did.Thank you for continuing to trust [place] with your care.   [facility]   Dolor de espalda durante el embarazo Back Pain in Pregnancy El dolor de espalda es habitual durante el [place]. Puede deberse a varios factores relacionados con los cambios durante esta etapa. Siga estas indicaciones en su casa: Control del dolor, el entumecimiento y la hinchazn      Si se lo indican, para el dolor de espalda repentino (agudo), aplique hielo en la zona dolorida. ? Ponga el hielo en una bolsa plstica. ? Coloque una [employer] piel y Copy. ? Coloque el hielo durante , 2a3veces al da.  Si se lo indican, aplique calor en la zona afectada antes de realizar ejercicios. Use la fuente de calor que el mdico le recomiende, como una compresa de calor hmedo o una almohadilla trmica. ? Coloque una [employer] piel y la fuente de Airline pilot. ? Aplique calor durante 20 a . ? Retire la fuente de calor si la piel se pone de color rojo brillante. Esto es especialmente importante si no puede sentir dolor, calor o fro. Puede correr un riesgo mayor de sufrir quemaduras.  Si se lo indican, aplique un masaje en la zona afectada. Actividad  Haga ejercicio como se lo haya indicado el mdico. Hacer actividad fsica suave es la mejor forma de evitar o controlar el dolor de espalda.  Prstele atencin a su cuerpo cuando se levante. Si siente dolor al levantarse, pida ayuda o flexione las rodillas. [employer], se usan los msculos de las piernas en lugar de los de la espalda.  Pngase en cuclillas al  levantar algo del suelo. No se agache.  Haga reposo en cama nicamente por perodos breves como se lo haya indicado el mdico. El reposo en cama solo debe hacerse cuando los episodios de dolor de espalda son ms intensos. Pararse, sentarse y acostarse  No permanezca sentada o de pie en el mismo lugar durante largos perodos.  Cuando est sentada, adopte una Education officer, museum. Asegrese de que su cabeza descanse sobre sus hombros y no est colgando hacia delante. Use una almohada en la parte inferior de la espalda si es necesario.  Trate de dormir de lado, de preferencia del lado izquierdo, con una almohada de sostn para embarazadas o 1 o 2 almohadas comunes entre las piernas. ? Si tiene Engineer, mining de espalda despus de una noche de descanso, la cama puede ser [employer]. ? Un colchn duro puede brindarle ms apoyo para la Merchandiser, retail. Indicaciones generales  No use zapatos con tacones altos.  Siga una dieta saludable. Trate de aumentar de peso dentro de las recomendaciones del mdico.  Use una faja de maternidad, un arns elstico o un cors para la espalda como se lo haya indicado el mdico.  Tome los medicamentos de venta libre y los recetados solamente como se lo haya indicado el mdico.  Building control surveyor con un fisioterapeuta o un masajista para Engineer, manufacturing systems de Human resources officer de espalda. La acupuntura o la terapia de [place]  pueden ser tiles.  Concurra a todas las visitas de control como se lo haya indicado el mdico. Esto es importante. Comunquese con un mdico si:  El dolor de Event organiser impide [employer] cotidianas.  Aumenta el dolor en otras partes del cuerpo. Solicite ayuda inmediatamente si:  Siente entumecimiento, hormigueo, debilidad o problemas con el uso de los brazos o las piernas.  Siente un dolor de espalda intenso que no puede controlar con los medicamentos.  Presenta modificaciones en el control de la vejiga o el  intestino.  Siente que le falta el aire, se marea o se desmaya.  Tiene nuseas, vmitos o sudoracin.  Siente un dolor de espalda que es rtmico y de tipo clico, similar a las contracciones del [place]. Las contracciones del parto suelen aparecer cada 1 a , duran aproximadamente y estn acompaadas de una sensacin de empujar o de presin en la pelvis.  Tiene dolor de espalda y rompe la bolsa de las aguas o tiene sangrado vaginal.  El dolor o el adormecimiento se extienden hacia la pierna.  El dolor aparece despus de una cada.  Siente dolor de un solo lado.  Observa sangre en la orina.  Le aparecen ampollas en la piel en la zona del dolor de espalda. Resumen  Puede deberse a varios factores relacionados con los cambios durante esta etapa.  Siga las indicaciones del mdico para Human resources officer, la rigidez y la hinchazn.  Haga ejercicio como se lo haya indicado el mdico. Hacer actividad fsica suave es la mejor forma de evitar o controlar el dolor de espalda.  Tome los medicamentos de venta libre y los recetados solamente como se lo haya indicado el mdico.  Oceanographer a todas las visitas de control como se lo haya indicado el mdico. Esto es importante. Esta informacin no tiene Theme park manager el consejo del mdico. Asegrese de hacerle al mdico cualquier pregunta que tenga. Document Released: 02/18/2011 Document Revised: 01/17/2018 Document Reviewed: 01/17/2018 Elsevier Interactive Patient Education  2019 [employer].  [place] trimestre de Psychiatrist Second Trimester of Pregnancy  El segundo trimestre va desde la semana14 hasta la 27 (desde el mes 4 hasta el 6). Este suele ser el momento en el que mejor se siente. En general, las nuseas matutinas han disminuido o han desaparecido completamente. Tendr ms energa y podr aumentarle el apetito. El beb en gestacin se desarrolla rpidamente. Hacia el final del sexto mes, el beb mide aproximadamente 9  pulgadas (23 cm) y pesa alrededor de 1 libras (700 g). Es probable que sienta al beb [employer] 18 y 20 semanas del [place]. Siga estas indicaciones en su casa: Medicamentos  Bax[employer] International de venta libre y los recetados solamente como se lo haya indicado el mdico. Algunos medicamentos son seguros para tomar durante el Psychiatrist y otros no lo son.  Tome vitaminas prenatales que contengan por lo menos (?g) de cido flico.  Si tiene dificultad para mover el intestino (estreimiento), tome un medicamento para ablandar las heces (laxante) si su mdico se lo autoriza. Comida y bebida   Ingiera alimentos saludables de [place] regular.  No coma carne cruda ni quesos sin cocinar.  Si obtiene poca cantidad de calcio de los alimentos que ingiere, consulte a su mdico sobre la posibilidad de tomar un suplemento diario de calcio.  Evite el consumo de alimentos ricos en grasas y azcares, como los alimentos fritos y los dulces.  Si tiene Programme researcher, broadcasting/film/video (nuseas) o devuelve (vomita): ? Ingiera  4 o 5comidas pequeas por Geophysical data processor de 3abundantes. ? Intente comer algunas galletitas saladas. ? Beba lquidos [employer], en lugar de [employer].  Para evitar el estreimiento: ? Consuma alimentos ricos en fibra, como frutas y verduras frescas, cereales integrales y frijoles. ? Beba suficiente lquido para mantener el pis (orina) claro o de color amarillo plido. Actividad  Haga ejercicios solamente como se lo haya indicado el mdico. Interrumpa la actividad fsica si comienza a tener calambres.  No haga ejercicio si hace demasiado calor, hay demasiada humedad o se encuentra en un lugar de mucha altura (altitud alta).  Evite levantar pesos [employer].  Use zapatos con tacones bajos. Mantenga una buena postura al sentarse y pararse.  Puede continuar teniendo [employer] [place] [employer], a menos que el mdico le indique lo contrario. Alivio del dolor y  del Dentist  Use un sostn que le brinde buen soporte si sus mamas estn sensibles.  Dese baos de asiento con agua tibia para Engineer, materials o las molestias causadas por las hemorroides. Use una crema para las hemorroides si el mdico la autoriza.  Descanse con las piernas elevadas si tiene calambres o dolor de cintura.  Si desarrolla venas hinchadas y abultadas (vrices) en las piernas: ? Use medias de compresin o medias de descanso como se lo haya indicado el mdico. ? Levante (eleve) los pies durante , 3 o 4veces por Futures trader. ? Limite el consumo de sal en sus alimentos. Cuidado prenatal  Tonga sus preguntas. Llvelas cuando concurra a las visitas prenatales.  Concurra a todas las visitas prenatales como se lo haya indicado el mdico. Esto es importante. Seguridad  [employer] cinturn de seguridad cuando conduzca.  Haga una lista de los nmeros de telfono de Associate Professor, que [employer] nmeros de telfono de familiares, amigos, [facility], as como los departamentos de polica y bomberos. Instrucciones generales  Consulte a su mdico sobre los Conoco[employer]illips debe comer o pdale que la ayude a Clinical research associate a quien pueda aconsejarla si necesita ese servicio.  Consulte a su mdico acerca de dnde se dictan clases prenatales cerca de donde vive. Comience las clases antes del mes 6 de embarazo.  No se d baos de inmersin en agua caliente, baos turcos ni saunas.  No se haga duchas vaginales ni use tampones o toallas higinicas perfumadas.  No mantenga las piernas cruzadas durante [place].  Vaya al dentista si an no lo hizo. Use un cepillo de cerdas suaves para cepillarse los dientes. Psese el hilo dental suavemente.  No fume, no consuma hierbas ni beba alcohol. No tome frmacos que el mdico no haya autorizado.  No consuma ningn producto que contenga nicotina o tabaco, como cigarrillos y Administrator, Civil Service. Si necesita ayuda para dejar de fumar,  consulte al mdico.  Evite el contacto con las bandejas sanitarias de los gatos y la tierra que estos animales usan. Estos elementos contienen bacterias que pueden causar defectos congnitos al beb y la posible prdida del beb (aborto espontneo) o la muerte fetal. Comunquese con un mdico si:  Tiene clicos leves o siente presin en la parte baja del vientre.  Tiene dolor al hacer pis (orinar).  Advierte un lquido con olor ftido que proviene de la vagina.  Tiene Programme researcher, broadcasting/film/video (nuseas), devuelve (vomita) o tiene deposiciones acuosas (diarrea).  Sufre un dolor persistente en el abdomen.  Siente mareos. Solicite ayuda de inmediato si:  Tiene fiebre.  Tiene una prdida de lquido por la vagina.  Tiene sangrado o  pequeas prdidas vaginales.  Siente dolor intenso o clicos en el abdomen.  Sube o baja de peso rpidamente.  Tiene dificultades para recuperar el aliento y siente dolor en el pecho.  Sbitamente se le hinchan mucho el rostro, las Pine Hillsmanos, los tobillos, los pies o las piernas.  No ha sentido los movimientos del beb durante Georgianne Fickuna hora.  Siente un dolor de cabeza intenso que no se alivia al tomar United Parcelmedicamentos.  Tiene dificultad para ver. Resumen  El segundo trimestre va desde la semana14 hasta la 27, desde el mes 4 hasta el 6. Este suele ser el momento en el que mejor se siente.  Para cuidarse y cuidar a su beb en gestacin, debe comer alimentos saludables, tomar medicamentos solamente si su mdico le indica que lo haga y hacer actividades que sean seguras para usted y su beb.  Llame al mdico si se enferma o si nota algo inusual acerca de su embarazo. Tambin llame al mdico si necesita ayuda para saber qu alimentos debe comer o si quiere saber qu actividades puede realizar de forma segura. Esta informacin no tiene Theme park managercomo fin reemplazar el consejo del mdico. Asegrese de hacerle al mdico cualquier pregunta que tenga. Document Released: 02/08/2013  Document Revised: 03/03/2017 Document Reviewed: 03/03/2017 Elsevier Interactive Patient Education  2019 ArvinMeritorElsevier Inc.  Dolor del ligamento redondo Round Ligament Pain  El ligamento redondo es un cordn de msculo y tejido que sirve de sostn para Careers information officerel tero. Puede volverse una fuente de dolor durante el embarazo si se distiende o se torsiona a medida que el beb crece. Generalmente, el dolor Cendant Corporationempieza en el segundo trimestre (semanas 13 a 28) de [place]arazo, y Software engineerpuede aparecer y Landscape architectdesaparecer hasta el momento del [place]parto. No se trata de un problema grave y no es perjudicial para el beb. El dolor del ligamento redondo suele ser agudo y punzante, y durar poco tiempo, pero tambin puede ser sordo, persistente y continuo. Se lo percibe en la regin inferior del abdomen o en la ingle. A menudo comienza en la zona ms profunda de la ingle y se extiende hacia regin externa de la cadera. El dolor puede producirse cuando usted:  Cambia sbitamente de posicin, como pasar rpidamente de estar sentada a ponerse de pie.  Se da vuelta en la cama.  Tose o estornuda.  Hace actividad fsica. Siga estas indicaciones en su casa:   Controle su afeccin para detectar cualquier cambio.  Cuando el dolor comience, reljese. Luego pruebe cualquiera de estos mtodos para aliviar el dolor: ? Psychologist, counsellingentarse. ? Flexionar las rodillas hacia el abdomen. ? Acostarse de costado con una almohada debajo del abdomen y Eastman Chemicalotra entre las piernas. ? Sentarse en una baera con agua tibia durante 15 a 20minutos o hasta que el dolor desaparezca.  Tome los medicamentos de venta libre y los recetados solamente como se lo haya indicado el mdico.  Muvase lentamente cuando se siente o se ponga de pie.  No haga caminatas largas si le generan dolor.  Suspenda o reduzca las actividades fsicas si Public relations account executivele generan dolor.  Concurra a todas las visitas de control como se lo haya indicado el mdico. Esto es importante. Comunquese con un mdico si:  El  dolor no desaparece con Scientist, research (medical)el tratamiento.  Tiene un dolor en la espalda que no tena antes.  El medicamento no resulta eficaz. Solicite ayuda inmediatamente si:  Tiene fiebre o escalofros.  Tiene contracciones uterinas.  Tiene una hemorragia vaginal abundante.  Tiene nuseas o vmitos.  Tiene diarrea.  Siente dolor  al Beatrix Shipperorinar. Resumen  El dolor del ligamento redondo se siente en la parte inferior del abdomen o la ingle. Generalmente es un dolor agudo y punzante, y dura poco tiempo. Tambin puede ser un dolor sordo, persistente y continuo.  Este dolor por lo general empieza en el segundo trimestre (semanas 13 a 28). Se produce porque el tero se estira a medida que el beb crece, y no es perjudicial para el beb.  Usted puede notar el dolor cuando cambia sbitamente de posicin, cuando toce o estornuda, o durante la actividad fsica.  Relajarse, flexionar las rodillas hacia el abdomen, acostarse sobre un lado o tomar un bao de agua tibia pueden ayudar a Engineer, siteeliminar el dolor.  Solicite ayuda a su mdico si el dolor no desaparece o si tiene hemorragia vaginal, nuseas, vmitos, diarrea o dolor al ConocoPhillipsorinar. Esta informacin no tiene Theme park managercomo fin reemplazar el consejo del mdico. Asegrese de hacerle al mdico cualquier pregunta que tenga. Document Released: 05/21/2008 Document Revised: 01/20/2018 Document Reviewed: 01/20/2018 Elsevier Interactive Patient Education  2019 ArvinMeritorElsevier Inc.   Engineer, agriculturalCitica (Sciatica) La citica es el dolor, la debilidad, el entumecimiento o el hormigueo a lo largo del nervio citico. El nervio citico comienza en la parte inferior de la espalda y desciende por la parte posterior de cada pierna. La citica se produce cuando este nervio se comprime o se ejerce presin sobre l. Suele desaparecer por s sola o con tratamiento. A veces, la citica puede volver a aparecer. CUIDADOS EN EL HOGAR Medicamentos  Baxter Internationalome los medicamentos de venta libre y los recetados solamente como  se lo haya indicado el mdico.  No conduzca ni use maquinaria pesada mientras toma analgsicos recetados. Control del dolor  Si se lo indican, aplique hielo sobre la zona afectada. ? Ponga el hielo en una bolsa plstica. ? Coloque una FirstEnergy Corptoalla entre la piel y la bolsa de hielo. ? Coloque el hielo durante 20minutos, 2 a 3veces por da.  Despus del hielo, aplique calor sobre la zona afectada antes de hacer ejercicio o con la frecuencia que le haya indicado el mdico. Use la fuente de calor que el mdico le indique, por ejemplo, una compresa de calor hmedo o una almohadilla trmica. ? Coloque una FirstEnergy Corptoalla entre la piel y la fuente de Airline pilotcalor. ? Aplique el calor durante 20 a 30minutos. ? Retire la fuente de calor si la piel se le pone de color rojo brillante. Esto es muy importante si no puede sentir el dolor, el calor o el fro. Puede correr un riesgo mayor de sufrir quemaduras. Actividad  Retome sus actividades habituales como se lo haya indicado el mdico. Pregntele al mdico qu actividades son seguras para usted. ? Evite las actividades que empeoran la citica.  Descanse por breves perodos Administratordurante el da. Hgalo recostado o de pie. Esto suele ser mejor que descansar sentado. ? Cuando descanse durante perodos ms largos, haga alguna actividad fsica o un estiramiento entre los perodos de descanso. ? Evite estar sentado durante largos perodos sin moverse. Levntese y Cecilmuvase al menos una vez cada hora.  Haga ejercicios y estrese con regularidad, como se lo indic el mdico.  No levante nada que pese ms de 10libras (4,5kg) mientras tenga sntomas de citica. ? Aunque no tenga sntomas, evite levantar objetos pesados. ? Evite levantar objetos pesados de forma repetida.  Al levantar objetos, hgalos siempre de una forma que sea segura para su cuerpo. Para esto, debe hacer lo siguiente: ? Flexione las rodillas. ? Mantenga el objeto cerca del  cuerpo. ? No se tuerza. Instrucciones  generales  Mantenga una buena postura. ? Evite reclinarse hacia adelante cuando est sentado. ? Evite encorvar la espalda mientras est de pie.  Mantenga un peso saludable.  Use calzado cmodo, que le d soporte al pie. Evite usar tacones.  Evite dormir sobre un colchn que sea demasiado blando o demasiado duro. Es posible que sienta menos dolor si duerme en un colchn con apoyo suficientemente firme para la espalda.  Concurra a todas las visitas de control como se lo haya indicado el mdico. Esto es importante. SOLICITE AYUDA SI:  Tiene un dolor con estas caractersticas: ? Lo despierta cuando est dormido. ? Empeora al estar recostado. ? Es Government social research officer que tena en el pasado. ? Dura ms de 4semanas.  Pierde peso sin proponrselo. SOLICITE AYUDA DE INMEDIATO SI:  No puede controlar la orina (miccin) ni la evacuacin de la materia fecal (defecacin).  Tiene debilidad en alguna de estas zonas, y la debilidad empeora. ? La parte inferior de la espalda. ? La parte inferior del abdomen (pelvis). ? Los glteos. ? Las piernas.  Siente irritacin o inflamacin en la espalda.  Tiene sensacin de ardor al [employer]ocoPhillips. Esta informacin no tiene Theme park manager el consejo del mdico. Asegrese de hacerle al mdico cualquier pregunta que tenga. Document Released: 07/11/2010 Document Revised: 09/30/2015 Document Reviewed: 02/15/2015 Elsevier Interactive Patient Education  2019 ArvinMeritor.

## 2018-07-28 NOTE — Progress Notes (Signed)
ROB-No complaints.  

## 2018-08-22 ENCOUNTER — Other Ambulatory Visit: Payer: Self-pay | Admitting: Certified Nurse Midwife

## 2018-08-22 DIAGNOSIS — Z3689 Encounter for other specified antenatal screening: Secondary | ICD-10-CM

## 2018-08-22 DIAGNOSIS — Z3492 Encounter for supervision of normal pregnancy, unspecified, second trimester: Secondary | ICD-10-CM

## 2018-08-22 NOTE — Progress Notes (Signed)
co

## 2018-08-23 ENCOUNTER — Other Ambulatory Visit: Payer: Self-pay | Admitting: Certified Nurse Midwife

## 2018-08-23 DIAGNOSIS — Z3689 Encounter for other specified antenatal screening: Secondary | ICD-10-CM

## 2018-08-23 DIAGNOSIS — Z3492 Encounter for supervision of normal pregnancy, unspecified, second trimester: Secondary | ICD-10-CM

## 2018-08-24 ENCOUNTER — Encounter: Payer: Medicaid Other | Admitting: Certified Nurse Midwife

## 2018-08-24 ENCOUNTER — Other Ambulatory Visit: Payer: Medicaid Other

## 2018-08-24 ENCOUNTER — Inpatient Hospital Stay: Admission: RE | Admit: 2018-08-24 | Payer: Medicaid Other | Source: Ambulatory Visit

## 2018-08-25 ENCOUNTER — Ambulatory Visit
Admission: RE | Admit: 2018-08-25 | Discharge: 2018-08-25 | Disposition: A | Discharge status: Home / Self Care | Payer: Medicaid Other | Source: Ambulatory Visit | Attending: Certified Nurse Midwife | Admitting: Certified Nurse Midwife

## 2018-08-25 DIAGNOSIS — Z3492 Encounter for supervision of normal pregnancy, unspecified, second trimester: Secondary | ICD-10-CM

## 2018-08-25 DIAGNOSIS — Z3689 Encounter for other specified antenatal screening: Secondary | ICD-10-CM

## 2018-08-26 ENCOUNTER — Encounter: Payer: Medicaid Other | Admitting: Certified Nurse Midwife

## 2018-08-26 ENCOUNTER — Other Ambulatory Visit: Payer: Self-pay

## 2018-08-26 ENCOUNTER — Ambulatory Visit
Admission: RE | Admit: 2018-08-26 | Discharge: 2018-08-26 | Disposition: A | Discharge status: Home / Self Care | Payer: Medicaid Other | Source: Ambulatory Visit | Attending: Certified Nurse Midwife | Admitting: Certified Nurse Midwife

## 2018-08-26 DIAGNOSIS — Z3689 Encounter for other specified antenatal screening: Secondary | ICD-10-CM | POA: Diagnosis present

## 2018-08-26 DIAGNOSIS — Z3492 Encounter for supervision of normal pregnancy, unspecified, second trimester: Secondary | ICD-10-CM | POA: Insufficient documentation

## 2018-08-29 ENCOUNTER — Encounter: Payer: Self-pay | Admitting: Certified Nurse Midwife

## 2018-08-31 ENCOUNTER — Other Ambulatory Visit: Payer: Self-pay

## 2018-08-31 ENCOUNTER — Ambulatory Visit (INDEPENDENT_AMBULATORY_CARE_PROVIDER_SITE_OTHER): Payer: Medicaid Other | Admitting: Certified Nurse Midwife

## 2018-08-31 VITALS — BP 105/60 | HR 68 | Wt 136.4 lb

## 2018-08-31 DIAGNOSIS — Z3492 Encounter for supervision of normal pregnancy, unspecified, second trimester: Secondary | ICD-10-CM

## 2018-08-31 LAB — POCT URINALYSIS DIPSTICK OB
GLUCOSE, UA: NEGATIVE
Ketones, UA: NEGATIVE
Leukocytes, UA: NEGATIVE
Nitrite, UA: NEGATIVE
POC,PROTEIN,UA: NEGATIVE
RBC UA: NEGATIVE
Spec Grav, UA: 1.01 (ref 1.010–1.025)
Urobilinogen, UA: 0.2 E.U./dL
pH, UA: 7.5 (ref 5.0–8.0)

## 2018-08-31 NOTE — Progress Notes (Signed)
ROB doing well. Feels good movement. Anatomy u/s from 3/6 reviewed. WNL ( see below). Discussed glucose screen @ 28 wks. Follow up 4 wks with Melody.   CLINICAL DATA:  Scan for anatomy. The patient is 33 year old gravida 2 para 1. Five signs EDC of 01/10/2019, the patient is 20 weeks 3 days.  EXAM: OBSTETRICAL ULTRASOUND >14 WKS  FINDINGS: Number of Fetuses: 1  Heart Rate:  163 bpm  Movement: Present  Presentation: Cephalic  Previa: None  Placental Location: Posterior  Amniotic Fluid (Subjective): Normal  Amniotic Fluid (Objective):  Vertical pocket = 3.6cm  FETAL BIOMETRY  BPD: 4.76cm 20w 3d  HC:   17.63cm 20w 1d  AC:   14.87cm 20w 1d  FL:   3.2cm 20w 0d  Current Mean GA: 20w 1d [place] EDC: 01/12/2019  Assigned GA:  20w 3d Assigned EDC: 01/10/2019  FETAL ANATOMY  Lateral Ventricles: Appears normal  Thalami/CSP: Appears normal  Posterior Fossa:  Appears normal  Nuchal Region: Appears normal   NFT= 4.2 millimeters  Upper Lip: Appears normal  Spine: Appears normal  4 Chamber Heart on Left: Appears normal  LVOT: Appears normal  RVOT: Appears normal  Stomach on Left: Appears normal  3 Vessel Cord: Appears normal  Cord Insertion site: Appears normal  Kidneys: Appears normal  Bladder: Appears normal  Extremities: Appears normal  Sex: Female  Technically difficult due to: Not applicable  Maternal Findings:  Cervix:  3.9 centimeters on transabdominal evaluation  IMPRESSION: 1. Single living intrauterine fetus in cephalic presentation. 2. Size and dates correlate well. 3. No fetal anomalies identified.

## 2018-08-31 NOTE — Patient Instructions (Signed)

## 2018-09-28 ENCOUNTER — Other Ambulatory Visit: Payer: Self-pay

## 2018-09-28 ENCOUNTER — Encounter: Payer: Self-pay | Admitting: Obstetrics and Gynecology

## 2018-09-28 ENCOUNTER — Ambulatory Visit (INDEPENDENT_AMBULATORY_CARE_PROVIDER_SITE_OTHER): Payer: Medicaid Other | Admitting: Obstetrics and Gynecology

## 2018-09-28 VITALS — BP 100/63 | HR 66 | Wt 140.4 lb

## 2018-09-28 DIAGNOSIS — Z3482 Encounter for supervision of other normal pregnancy, second trimester: Secondary | ICD-10-CM

## 2018-09-28 LAB — POCT URINALYSIS DIPSTICK OB
Bilirubin, UA: NEGATIVE
Blood, UA: NEGATIVE
Glucose, UA: NEGATIVE
Ketones, UA: NEGATIVE
Leukocytes, UA: NEGATIVE
Nitrite, UA: NEGATIVE
POC,PROTEIN,UA: NEGATIVE
Spec Grav, UA: 1.015 (ref 1.010–1.025)
Urobilinogen, UA: 0.2 E.U./dL
pH, UA: 8.5 — AB (ref 5.0–8.0)

## 2018-09-28 NOTE — Patient Instructions (Signed)
                                                                                                                 FREQUENTLY ASKED QUESTIONS FOR OBSTETRICS/PEDIATRICS    Q: Why are visitor restrictions different for maternity care areas?  [place] is restricting visitors for the duration of the patient's hospitalization. The birth of a child involves the mother, considered the patient, and a birthing partner. These are unprecedented times and we are making the exception to allow a birthing partner to be a part of the patient unit. No other guests will be allowed in our [facility] at [facility] and at [facility].   Q: Are credentialed doulas allowed to support their existing patients?  We acknowledge the value these doula partnerships offer our care teams and many birthing families in our communities. Each laboring mother is allowed one birthing partner of the patient's choosing for her entire hospitalization.   Q: Are visitor restrictions different for hospitalized children?  Pediatric patients (infants and children under 17 years of age), such as those in the Children's Unit, Pediatric ICU and NICU, will be allowed two visitors (parents or legal guardians)   Q: Are pregnant women at an increased risk for COVID-19?  The American College of Obstetricians and Gynecologists (ACOG) is monitoring closely the coronavirus pandemic. With the limited information available, data does not indicate pregnant women are at an increased risk. However, pregnant women are known to be at greater risk for respiratory infections like flu. With that in mind, expectant mothers are considered an at-risk population for COVID-19, according to ACOG.   Q: Are newborns at an increased risk for COVID-19?  A limited sample of COVID-19 data with newborns indicates the virus is not transferred to the infant during pregnancy. However, postpartum separation is recommended by the Centers for  Disease Control (CDC). As a result [facility] recommends and strongly encourages temporary separation of moms and babies who test positive for COVID-19 or are awaiting results to rule out COVID-19 based on CDC guidelines.   Q: If you have a suspected case of COVID-19, is the NICU couplet care room an option?  No. If either patient is considered at-risk for having COVID-19, the [facility] at [facility] will not use the NICU couplet care rooms for that family.   Q: [place] is urging that elective procedures be postponed. What is considered elective for women's and children's service line?  NOT ELECTIVE: Obstetric procedures, even those with an element of choice on timing, are not considered elective. Circumcisions are considered elective procedures, however, these do not deplete blood products and other resources, which is the spirit in which the COVID-19 postponement of elective procedures was intended. Therefore, circumcisions will be allowed.   ELECTIVE: Postpartum tubal ligations are considered elective and should be postponed. Q&A for Obstetricians, Gynecologists and Pediatricians  Published September 09, 2018   [place] supports as much as possible the medical care   team working with the patient's individual needs to address timing during these unprecedented times. We seek the support of our medical care team in preserving needed resources throughout our crisis response to COVID-19.   Q: How does COVID-19 impact breastfeeding?  Breastmilk is safe for your baby - even if the mother has tested positive for COVID-19. If a COVID-19+ mother decides to breastfeed while inpatient and after discharge, we suggest proper protective equipment be worn and hand hygiene be performed before and after feeding the infant. The new mother also has the option to pump her milk and have a healthy family member feed the baby to protect the baby from getting the virus.   Q: Should we urge  patients to avoid baby showers and large gatherings?  Yes. As has been recommended for all citizens in our communities, gatherings of 10 or more should be avoided - pregnant or not. Seek creative options for "hosting" baby showers through electronic means that honor the request for social distancing during this time of heightened awareness.   Q: Should patients miss their prenatal appointments?  No. Prenatal visits are NOT elective. While we want to limit contact and exposure, prenatal care is vital right now. Contact your physician's office if you have concerns about your visits. We are limiting outpatient office visits to the patient and one guest in order to reduce the potential for exposure.   Q: What if a pregnant woman feels sick? Should she miss her prenatal visit then?  A pregnant woman experiencing coronavirus-like symptoms (i.e., cough, fever, difficulty breathing, shortness of breath, gastrointestinal issues) should contact her pregnancy care provider by phone. Her medical professional can best determine whether she should use a video visit or possibly go to a collection site to be tested for COVID-19. Contacting her primary care provider or her pregnancy care provider is her first step.   Q: What can I do about childbirth education? All the classes are cancelled.  The [facility] will offer online learning to support mothers on their journey. We currently offer Understanding Childbirth, Understanding Breastfeeding and Understanding Newborn Care as an online class. Please visit our website, [email or web address], to register for an online class.   Q: How can I keep from getting COVID-19? Q&A for Obstetricians, Gynecologists and Pediatricians  Published September 09, 2018   Together, we can reduce the risk of exposure to the virus and help you and your family remain healthy and safe. One of the best ways to protect yourself is to wash your hands frequently using soap and  water. Also, you should avoid touching your eyes, nose and mouth with unwashed hands, avoid physical contact with others and practice social distancing.   Q: How are employees being informed about what to do?  [place] leaders receive a daily COVID-19 update and share relevant information with their teams. This is a time when health care professionals are called on to lead within our community. We appreciate our staff's engagement with our COVID-19 updates and encourage them to share best practices on reducing the spread of the virus with our patients and community. We are prepared to provide the exceptional COVID-19 care and coordination our community needs, expects and deserves.   Q: Who's in charge of this issue at [facility]?  The leadership structure and process established to address COVID-19 includes Chief Physician Executive Bruce Swords, MD; Infection Prevention Medical Director Cynthia Snider, MD; and Infection Prevention Interim Director Sara Wall, MSN, RN, CIC, CSPDT. A team   of [place] experts reflecting a broad spectrum of our workforce is meeting daily to evaluate new information we receive about COVID-19 and to adapt policies and practices accordingly.                         Published September 09, 2018    

## 2018-09-28 NOTE — Progress Notes (Signed)
ROB- doing well, discussed covid-19, glucola next visit. 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

## 2018-10-27 ENCOUNTER — Encounter: Payer: Self-pay | Admitting: Certified Nurse Midwife

## 2018-10-27 ENCOUNTER — Telehealth: Payer: Self-pay

## 2018-10-27 NOTE — Telephone Encounter (Signed)
Coronavirus (COVID-19) Are you at risk?  Are you at risk for the Coronavirus (COVID-19)?  To be considered HIGH RISK for Coronavirus (COVID-19), you have to meet the following criteria:  . Traveled to [place], [place], [place], [place] or [place]; or in the [place] to [place], [place], [place], or [place]; and have fever, cough, and shortness of breath within the last 2 weeks of travel OR . Been in close contact with a person diagnosed with COVID-19 within the last 2 weeks and have fever, cough, and shortness of breath . IF YOU DO NOT MEET THESE CRITERIA, YOU ARE CONSIDERED LOW RISK FOR COVID-19.  What to do if you are HIGH RISK for COVID-19?  . If you are having a medical emergency, call 911. . Seek medical care right away. Before you go to a doctor's office, urgent care or emergency department, call ahead and tell them about your recent travel, contact with someone diagnosed with COVID-19, and your symptoms. You should receive instructions from your physician's office regarding next steps of care.  . When you arrive at healthcare provider, tell the healthcare staff immediately you have returned from visiting [place], [place], [place], [place] or [place]; or traveled in the [place] to [place], [place], [place], or [place]; in the last two weeks or you have been in close contact with a person diagnosed with COVID-19 in the last 2 weeks.   . Tell the health care staff about your symptoms: fever, cough and shortness of breath. . After you have been seen by a medical provider, you will be either: o Tested for (COVID-19) and discharged home on quarantine except to seek medical care if symptoms worsen, and asked to  - Stay home and avoid contact with others until you get your results (4-5 days)  - Avoid travel on public transportation if possible (such as bus, train, or airplane) or o Sent to the Emergency Department by EMS for evaluation, COVID-19 testing, and possible  admission depending on your condition and test results.  What to do if you are LOW RISK for COVID-19?  Reduce your risk of any infection by using the same precautions used for avoiding the common cold or flu:  . Wash your hands often with soap and warm water for at least 20 seconds.  If soap and water are not readily available, use an alcohol-based hand sanitizer with at least 60% alcohol.  . If coughing or sneezing, cover your mouth and nose by coughing or sneezing into the elbow areas of your shirt or coat, into a tissue or into your sleeve (not your hands). . Avoid shaking hands with others and consider head nods or verbal greetings only. . Avoid touching your eyes, nose, or mouth with unwashed hands.  . Avoid close contact with people who are sick. . Avoid places or events with large numbers of people in one location, like concerts or sporting events. . Carefully consider travel plans you have or are making. . If you are planning any travel outside or inside the [place], visit the CDC's Travelers' Health webpage for the latest health notices. . If you have some symptoms but not all symptoms, continue to monitor at home and seek medical attention if your symptoms worsen. . If you are having a medical emergency, call 911.   ADDITIONAL HEALTHCARE OPTIONS FOR PATIENTS  [facility] / e-Visit: [email or web address]         [facility]: [phone]  [facility]: [phone]                   [facility]: [phone]   Pre-screen negative, DM.   

## 2018-10-28 ENCOUNTER — Other Ambulatory Visit: Payer: Self-pay

## 2018-10-28 ENCOUNTER — Other Ambulatory Visit: Payer: Medicaid Other

## 2018-10-28 ENCOUNTER — Ambulatory Visit (INDEPENDENT_AMBULATORY_CARE_PROVIDER_SITE_OTHER): Payer: Medicaid Other | Admitting: Certified Nurse Midwife

## 2018-10-28 VITALS — BP 96/55 | HR 69 | Wt 145.0 lb

## 2018-10-28 DIAGNOSIS — Z3482 Encounter for supervision of other normal pregnancy, second trimester: Secondary | ICD-10-CM

## 2018-10-28 DIAGNOSIS — Z130 Encounter for screening for diseases of the blood and blood-forming organs and certain disorders involving the immune mechanism: Secondary | ICD-10-CM

## 2018-10-28 LAB — POCT URINALYSIS DIPSTICK OB
Bilirubin, UA: NEGATIVE
Blood, UA: NEGATIVE
Glucose, UA: NEGATIVE
Ketones, UA: NEGATIVE
Leukocytes, UA: NEGATIVE
Nitrite, UA: NEGATIVE
POC,PROTEIN,UA: NEGATIVE
Spec Grav, UA: 1.01 (ref 1.010–1.025)
Urobilinogen, UA: 0.2 E.U./dL
pH, UA: 6 (ref 5.0–8.0)

## 2018-10-28 MED ORDER — TETANUS-DIPHTH-ACELL PERTUSSIS 5-2.5-18.5 LF-MCG/0.5 IM SUSP
0.5000 mL | Freq: Once | INTRAMUSCULAR | Status: AC
  Administered 2018-10-28: 0.5 mL via INTRAMUSCULAR

## 2018-10-28 NOTE — Progress Notes (Signed)
ROB-Doing well. Labs today. Blood transfusion consent reviewed and signed. TDaP given. Third trimester handouts provided. Anticipatory guidance regarding course of prenatal care. Reviewed red flag symptoms and when to call. RTC x 2 weeks for ROB or sooner if needed.

## 2018-10-28 NOTE — Patient Instructions (Addendum)
Fetal Movement Counts Patient Name: ________________________________________________ Patient Due Date: ____________________ What is a fetal movement count?  A fetal movement count is the number of times that you feel your baby move during a certain amount of time. This may also be called a fetal kick count. A fetal movement count is recommended for every pregnant woman. You may be asked to start counting fetal movements as early as week 28 of your pregnancy. Pay attention to when your baby is most active. You may notice your baby's sleep and wake cycles. You may also notice things that make your baby move more. You should do a fetal movement count:  When your baby is normally most active.  At the same time each day. A good time to count movements is while you are resting, after having something to eat and drink. How do I count fetal movements? 1. Find a quiet, comfortable area. Sit, or lie down on your side. 2. Write down the date, the start time and stop time, and the number of movements that you felt between those two times. Take this information with you to your health care visits. 3. For 2 hours, count kicks, flutters, swishes, rolls, and jabs. You should feel at least 10 movements during 2 hours. 4. You may stop counting after you have felt 10 movements. 5. If you do not feel 10 movements in 2 hours, have something to eat and drink. Then, keep resting and counting for 1 hour. If you feel at least 4 movements during that hour, you may stop counting. Contact a health care provider if:  You feel fewer than 4 movements in 2 hours.  Your baby is not moving like he or she usually does. Date: ____________ Start time: ____________ Stop time: ____________ Movements: ____________ Date: ____________ Start time: ____________ Stop time: ____________ Movements: ____________ Date: ____________ Start time: ____________ Stop time: ____________ Movements: ____________ Date: ____________ Start time:  ____________ Stop time: ____________ Movements: ____________ Date: ____________ Start time: ____________ Stop time: ____________ Movements: ____________ Date: ____________ Start time: ____________ Stop time: ____________ Movements: ____________ Date: ____________ Start time: ____________ Stop time: ____________ Movements: ____________ Date: ____________ Start time: ____________ Stop time: ____________ Movements: ____________ Date: ____________ Start time: ____________ Stop time: ____________ Movements: ____________ This information is not intended to replace advice given to you by your health care provider. Make sure you discuss any questions you have with your health care provider. Document Released: 07/08/2006 Document Revised: 02/05/2016 Document Reviewed: 07/18/2015 Elsevier Interactive Patient Education  2019 [employer] Third Trimester of Pregnancy  The third trimester is from week 28 through week 40 (months 7 through 9). This trimester is when your unborn baby (fetus) is growing very fast. At the end of the ninth month, the unborn baby is about 20 inches in length. It weighs about 6-10 pounds. Follow these instructions at home: Medicines  Take over-the-counter and prescription medicines only as told by your doctor. Some medicines are safe and some medicines are not safe during pregnancy.  Take a prenatal vitamin that contains at least 600 micrograms (mcg) of folic acid.  If you have trouble pooping (constipation), take medicine that will make your stool soft (stool softener) if your doctor approves. Eating and drinking   Eat regular, healthy meals.  Avoid raw meat and uncooked cheese.  If you get low calcium from the food you eat, talk to your doctor about taking a daily calcium supplement.  Eat four or five small meals rather than three large meals a day.  Avoid foods that are high in fat and sugars, such as fried and sweet foods.  To prevent constipation: ? Eat foods  that are high in fiber, like fresh fruits and vegetables, whole grains, and beans. ? Drink enough fluids to keep your pee (urine) clear or pale yellow. Activity  Exercise only as told by your doctor. Stop exercising if you start to have cramps.  Avoid heavy lifting, wear low heels, and sit up straight.  Do not exercise if it is too hot, too humid, or if you are in a place of great height (high altitude).  You may continue to have sex unless your doctor tells you not to. Relieving pain and discomfort  Wear a good support bra if your breasts are tender.  Take frequent breaks and rest with your legs raised if you have leg cramps or low back pain.  Take warm water baths (sitz baths) to soothe pain or discomfort caused by hemorrhoids. Use hemorrhoid cream if your doctor approves.  If you develop puffy, bulging veins (varicose veins) in your legs: ? Wear support hose or compression stockings as told by your doctor. ? Raise (elevate) your feet for 15 minutes, 3-4 times a day. ? Limit salt in your food. Safety  Wear your seat belt when driving.  Make a list of emergency phone numbers, including numbers for family, friends, the hospital, and police and fire departments. Preparing for your baby's arrival To prepare for the arrival of your baby:  Take prenatal classes.  Practice driving to the hospital.  Visit the hospital and tour the maternity area.  Talk to your work about taking leave once the baby comes.  Pack your hospital bag.  Prepare the baby's room.  Go to your doctor visits.  Buy a rear-facing car seat. Learn how to install it in your car. General instructions  Do not use hot tubs, steam rooms, or saunas.  Do not use any products that contain nicotine or tobacco, such as cigarettes and e-cigarettes. If you need help quitting, ask your doctor.  Do not drink alcohol.  Do not douche or use tampons or scented sanitary pads.  Do not cross your legs for long periods  of time.  Do not travel for long distances unless you must. Only do so if your doctor says it is okay.  Visit your dentist if you have not gone during your pregnancy. Use a soft toothbrush to brush your teeth. Be gentle when you floss.  Avoid cat litter boxes and soil used by cats. These carry germs that can cause birth defects in the baby and can cause a loss of your baby (miscarriage) or stillbirth.  Keep all your prenatal visits as told by your doctor. This is important. Contact a doctor if:  You are not sure if you are in labor or if your water has broken.  You are dizzy.  You have mild cramps or pressure in your lower belly.  You have a nagging pain in your belly area.  You continue to feel sick to your stomach, you throw up, or you have watery poop.  You have bad smelling fluid coming from your vagina.  You have pain when you pee. Get help right away if:  You have a fever.  You are leaking fluid from your vagina.  You are spotting or bleeding from your vagina.  You have severe belly cramps or pain.  You lose or gain weight quickly.  You have trouble catching your breath and have  chest pain.  You notice sudden or extreme puffiness (swelling) of your face, hands, ankles, feet, or legs.  You have not felt the baby move in over an hour.  You have severe headaches that do not go away with medicine.  You have trouble seeing.  You are leaking, or you are having a gush of fluid, from your vagina before you are 37 weeks.  You have regular belly spasms (contractions) before you are 37 weeks. Summary  The third trimester is from week 28 through week 40 (months 7 through 9). This time is when your unborn baby is growing very fast.  Follow your doctor's advice about medicine, food, and activity.  Get ready for the arrival of your baby by taking prenatal classes, getting all the baby items ready, preparing the baby's room, and visiting your doctor to be checked.  Get  help right away if you are bleeding from your vagina, or you have chest pain and trouble catching your breath, or if you have not felt your baby move in over an hour. This information is not intended to replace advice given to you by your health care provider. Make sure you discuss any questions you have with your health care provider. Document Released: 09/02/2009 Document Revised: 07/14/2016 Document Reviewed: 07/14/2016 Elsevier Interactive Patient Education  2019 [employer] Inc[employer]  Pain Relief During Labor and Delivery Many things can cause pain during labor and delivery, including:  Pressure on bones and ligaments due to the baby moving through the pelvis.  Stretching of tissues due to the baby moving through the birth canal.  Muscle tension due to anxiety or nervousness.  The uterus tightening (contracting) and relaxing to help move the baby. There are many ways to deal with the pain of labor and delivery. They include:  Taking prenatal classes. Taking these classes helps you know what to expect during your babys birth. What you learn will increase your confidence and decrease your anxiety.  Practicing relaxation techniques or doing relaxing activities, such as: ? Focused breathing. ? Meditation. ? Visualization. ? Aroma therapy. ? Listening to your favorite music. ? Hypnosis.  Taking a warm shower or bath (hydrotherapy). This may: ? Provide comfort and relaxation. ? Lessen your perception of pain. ? Decrease the amount of pain medicine needed. ? Decrease the length of labor.  Getting a massage or counterpressure on your back.  Applying warm packs or ice packs.  Changing positions often, moving around, or using a birthing ball.  Getting: ? Pain medicine through an IV or injection into a muscle. ? Pain medicine inserted into your spinal column. ? Injections of sterile water just under the skin on your lower back (intradermal injections). ? Laughing gas (nitrous  oxide). Discuss your pain control options with your health care provider during your prenatal visits. Explore the options offered by your hospital or birth center. What kinds of medicine are available? There are two kinds of medicines that can be used to relieve pain during labor and delivery:  Analgesics. These medicines decrease pain without causing you to lose feeling or the ability to move your muscles.  Anesthetics. These medicines block feeling in the body and can decrease your ability to move freely. Both of these kinds of medicine can cause minor side effects, such as nausea, trouble concentrating, and sleepiness. They can also decrease the baby's heart rate before birth and affect the babys breathing rate after birth. For this reason, health care providers are careful about when and how much medicine  is given. What are specific medicines and procedures that provide pain relief? Local Anesthetics Local anesthetics are used to numb a small area of the body. They may be used along with another kind of anesthetic or used to numb the nerves of the vagina, cervix, and perineum during the second stage of labor. General Anesthetics General anesthetics cause you to lose consciousness so you do not feel pain. They are usually only used for an emergency cesarean delivery. General anesthetics are given through an IV tube and a mask. Pudendal Block A pudendal block is a form of local anesthetic. It may be used to relieve the pain associated with pushing or stretching of the perineum at the time of delivery or to further numb the perineum. A pudendal block is done by injecting numbing medicine through the vaginal wall into a nerve in the pelvis. Epidural Analgesia Epidural analgesia is given through a flexible IV catheter that is inserted into the lower back. Numbing medicine is delivered continuously to the area near your spinal column nerves (epidural space). After having this type of analgesia, you  may be able to move your legs but you most likely will not be able to walk. Depending on the amount of medicine given, you may lose all feeling in the lower half of your body, or you may retain some level of sensation, including the urge to push. Epidural analgesia can be used to provide pain relief for a vaginal birth. Spinal Block A spinal block is similar to epidural analgesia, but the medicine is injected into the spinal fluid instead of the epidural space. A spinal block is only given once. It starts to relieve pain quickly, but the pain relief lasts only 1-6 hours. Spinal blocks can be used for cesarean deliveries. Combined Spinal-Epidural (CSE) Block A CSE block combines the effects of a spinal block and epidural analgesia. The spinal block works quickly to block all pain. The epidural analgesia provides continuous pain relief, even after the effects of the spinal block have worn off. This information is not intended to replace advice given to you by your health care provider. Make sure you discuss any questions you have with your health care provider. Document Released: 09/24/2008 Document Revised: 11/15/2015 Document Reviewed: 10/30/2015 Elsevier Interactive Patient Education  2019 [employer].   [facility]  [address] [place], [place], [place] [address]  Phone: [phone]   [facility] (second location)  [address] [place], [place] [address]  Phone: [phone]   [facility]) [address] [place], [place] [address] Phone: [phone]   [facility]  [address] [place], [place] [address]  Phone: [phone] Breastfeeding  Choosing to breastfeed is one of the best decisions you can make for yourself and your baby. A change in hormones during pregnancy causes your breasts to make breast milk in your milk-producing glands. Hormones prevent breast milk from being released before  your baby is born. They also prompt milk flow after birth. Once breastfeeding has begun, thoughts of your baby, as well as his or her sucking or crying, can stimulate the release of milk from your milk-producing glands. Benefits of breastfeeding Research shows that breastfeeding offers many health benefits for infants and mothers. It also offers a cost-free and convenient way to feed your baby. For your baby  Your first milk (colostrum) helps your baby's digestive system to function better.  Special cells in your milk (antibodies) help your baby  to fight off infections.  Breastfed babies are less likely to develop asthma, allergies, obesity, or type 2 diabetes. They are also at lower risk for sudden infant death syndrome (SIDS).  Nutrients in breast milk are better able to meet your babys needs compared to infant formula.  Breast milk improves your baby's brain development. For you  Breastfeeding helps to create a very special bond between you and your baby.  Breastfeeding is convenient. Breast milk costs nothing and is always available at the correct temperature.  Breastfeeding helps to burn calories. It helps you to lose the weight that you gained during pregnancy.  Breastfeeding makes your uterus return faster to its size before pregnancy. It also slows bleeding (lochia) after you give birth.  Breastfeeding helps to lower your risk of developing type 2 diabetes, osteoporosis, rheumatoid arthritis, cardiovascular disease, and breast, ovarian, uterine, and endometrial cancer later in life. Breastfeeding basics Starting breastfeeding  Find a comfortable place to sit or lie down, with your neck and back well-supported.  Place a pillow or a rolled-up blanket under your baby to bring him or her to the level of your breast (if you are seated). Nursing pillows are specially designed to help support your arms and your baby while you breastfeed.  Make sure that your baby's tummy (abdomen)  is facing your abdomen.  Gently massage your breast. With your fingertips, massage from the outer edges of your breast inward toward the nipple. This encourages milk flow. If your milk flows slowly, you may need to continue this action during the feeding.  Support your breast with 4 fingers underneath and your thumb above your nipple (make the letter "C" with your hand). Make sure your fingers are well away from your nipple and your babys mouth.  Stroke your baby's lips gently with your finger or nipple.  When your baby's mouth is open wide enough, quickly bring your baby to your breast, placing your entire nipple and as much of the areola as possible into your baby's mouth. The areola is the colored area around your nipple. ? More areola should be visible above your baby's upper lip than below the lower lip. ? Your baby's lips should be opened and extended outward (flanged) to ensure an adequate, comfortable latch. ? Your baby's tongue should be between his or her lower gum and your breast.  Make sure that your baby's mouth is correctly positioned around your nipple (latched). Your baby's lips should create a seal on your breast and be turned out (everted).  It is common for your baby to suck about 2-3 minutes in order to start the flow of breast milk. Latching Teaching your baby how to latch onto your breast properly is very important. An improper latch can cause nipple pain, decreased milk supply, and poor weight gain in your baby. Also, if your baby is not latched onto your nipple properly, he or she may swallow some air during feeding. This can make your baby fussy. Burping your baby when you switch breasts during the feeding can help to get rid of the air. However, teaching your baby to latch on properly is still the best way to prevent fussiness from swallowing air while breastfeeding. Signs that your baby has successfully latched onto your nipple  Silent tugging or silent sucking,  without causing you pain. Infant's lips should be extended outward (flanged).  Swallowing heard between every 3-4 sucks once your milk has started to flow (after your let-down milk reflex occurs).  Muscle movement  above and in front of his or her ears while sucking. Signs that your baby has not successfully latched onto your nipple  Sucking sounds or smacking sounds from your baby while breastfeeding.  Nipple pain. If you think your baby has not latched on correctly, slip your finger into the corner of your babys mouth to break the suction and place it between your baby's gums. Attempt to start breastfeeding again. Signs of successful breastfeeding Signs from your baby  Your baby will gradually decrease the number of sucks or will completely stop sucking.  Your baby will fall asleep.  Your baby's body will relax.  Your baby will retain a small amount of milk in his or her mouth.  Your baby will let go of your breast by himself or herself. Signs from you  Breasts that have increased in firmness, weight, and size 1-3 hours after feeding.  Breasts that are softer immediately after breastfeeding.  Increased milk volume, as well as a change in milk consistency and color by the fifth day of breastfeeding.  Nipples that are not sore, cracked, or bleeding. Signs that your baby is getting enough milk  Wetting at least 1-2 diapers during the first 24 hours after birth.  Wetting at least 5-6 diapers every 24 hours for the first week after birth. The urine should be clear or pale yellow by the age of 5 days.  Wetting 6-8 diapers every 24 hours as your baby continues to grow and develop.  At least 3 stools in a 24-hour period by the age of 5 days. The stool should be soft and yellow.  At least 3 stools in a 24-hour period by the age of 7 days. The stool should be seedy and yellow.  No loss of weight greater than 10% of birth weight during the first 3 days of life.  Average weight  gain of 4-7 oz (113-198 g) per week after the age of 4 days.  Consistent daily weight gain by the age of 5 days, without weight loss after the age of 2 weeks. After a feeding, your baby may spit up a small amount of milk. This is normal. Breastfeeding frequency and duration Frequent feeding will help you make more milk and can prevent sore nipples and extremely full breasts (breast engorgement). Breastfeed when you feel the need to reduce the fullness of your breasts or when your baby shows signs of hunger. This is called "breastfeeding on demand." Signs that your baby is hungry include:  Increased alertness, activity, or restlessness.  Movement of the head from side to side.  Opening of the mouth when the corner of the mouth or cheek is stroked (rooting).  Increased sucking sounds, smacking lips, cooing, sighing, or squeaking.  Hand-to-mouth movements and sucking on fingers or hands.  Fussing or crying. Avoid introducing a pacifier to your baby in the first 4-6 weeks after your baby is born. After this time, you may choose to use a pacifier. Research has shown that pacifier use during the first year of a baby's life decreases the risk of sudden infant death syndrome (SIDS). Allow your baby to feed on each breast as long as he or she wants. When your baby unlatches or falls asleep while feeding from the first breast, offer the second breast. Because newborns are often sleepy in the first few weeks of life, you may need to awaken your baby to get him or her to feed. Breastfeeding times will vary from baby to baby. However, the following  rules can serve as a guide to help you make sure that your baby is properly fed:  Newborns (babies 64 weeks of age or younger) may breastfeed every 1-3 hours.  Newborns should not go without breastfeeding for longer than 3 hours during the day or 5 hours during the night.  You should breastfeed your baby a minimum of 8 times in a 24-hour period. Breast milk  pumping     Pumping and storing breast milk allows you to make sure that your baby is exclusively fed your breast milk, even at times when you are unable to breastfeed. This is especially important if you go back to work while you are still breastfeeding, or if you are not able to be present during feedings. Your lactation consultant can help you find a method of pumping that works best for you and give you guidelines about how long it is safe to store breast milk. Caring for your breasts while you breastfeed Nipples can become dry, cracked, and sore while breastfeeding. The following recommendations can help keep your breasts moisturized and healthy:  Avoid using soap on your nipples.  Wear a supportive bra designed especially for nursing. Avoid wearing underwire-style bras or extremely tight bras (sports bras).  Air-dry your nipples for 3-4 minutes after each feeding.  Use only cotton bra pads to absorb leaked breast milk. Leaking of breast milk between feedings is normal.  Use lanolin on your nipples after breastfeeding. Lanolin helps to maintain your skin's normal moisture barrier. Pure lanolin is not harmful (not toxic) to your baby. You may also hand express a few drops of breast milk and gently massage that milk into your nipples and allow the milk to air-dry. In the first few weeks after giving birth, some women experience breast engorgement. Engorgement can make your breasts feel heavy, warm, and tender to the touch. Engorgement peaks within 3-5 days after you give birth. The following recommendations can help to ease engorgement:  Completely empty your breasts while breastfeeding or pumping. You may want to start by applying warm, moist heat (in the shower or with warm, water-soaked hand towels) just before feeding or pumping. This increases circulation and helps the milk flow. If your baby does not completely empty your breasts while breastfeeding, pump any extra milk after he or she is  finished.  Apply ice packs to your breasts immediately after breastfeeding or pumping, unless this is too uncomfortable for you. To do this: ? Put ice in a plastic bag. ? Place a towel between your skin and the bag. ? Leave the ice on for 20 minutes, 2-3 times a day.  Make sure that your baby is latched on and positioned properly while breastfeeding. If engorgement persists after 48 hours of following these recommendations, contact your health care provider or a Advertising copywriter. Overall health care recommendations while breastfeeding  Eat 3 healthy meals and 3 snacks every day. Well-nourished mothers who are breastfeeding need an additional 450-500 calories a day. You can meet this requirement by increasing the amount of a balanced diet that you eat.  Drink enough water to keep your urine pale yellow or clear.  Rest often, relax, and continue to take your prenatal vitamins to prevent fatigue, stress, and low vitamin and mineral levels in your body (nutrient deficiencies).  Do not use any products that contain nicotine or tobacco, such as cigarettes and e-cigarettes. Your baby may be harmed by chemicals from cigarettes that pass into breast milk and exposure to secondhand smoke.  If you need help quitting, ask your health care provider.  Avoid alcohol.  Do not use illegal drugs or marijuana.  Talk with your health care provider before taking any medicines. These include over-the-counter and prescription medicines as well as vitamins and herbal supplements. Some medicines that may be harmful to your baby can pass through breast milk.  It is possible to become pregnant while breastfeeding. If birth control is desired, ask your health care provider about options that will be safe while breastfeeding your baby. Where to find more information: [employer]: www.llli.org Contact a health care provider if:  You feel like you want to stop breastfeeding or have become  frustrated with breastfeeding.  Your nipples are cracked or bleeding.  Your breasts are red, tender, or warm.  You have: ? Painful breasts or nipples. ? A swollen area on either breast. ? A fever or chills. ? Nausea or vomiting. ? Drainage other than breast milk from your nipples.  Your breasts do not become full before feedings by the fifth day after you give birth.  You feel sad and depressed.  Your baby is: ? Too sleepy to eat well. ? Having trouble sleeping. ? More than 20 week old and wetting fewer than 6 diapers in a 24-hour period. ? Not gaining weight by 39 days of age.  Your baby has fewer than 3 stools in a 24-hour period.  Your baby's skin or the white parts of his or her eyes become yellow. Get help right away if:  Your baby is overly tired (lethargic) and does not want to wake up and feed.  Your baby develops an unexplained fever. Summary  Breastfeeding offers many health benefits for infant and mothers.  Try to breastfeed your infant when he or she shows early signs of hunger.  Gently tickle or stroke your baby's lips with your finger or nipple to allow the baby to open his or her mouth. Bring the baby to your breast. Make sure that much of the areola is in your baby's mouth. Offer one side and burp the baby before you offer the other side.  Talk with your health care provider or lactation consultant if you have questions or you face problems as you breastfeed. This information is not intended to replace advice given to you by your health care provider. Make sure you discuss any questions you have with your health care provider. Document Released: 06/08/2005 Document Revised: 07/10/2016 Document Reviewed: 07/10/2016 Elsevier Interactive Patient Education  2019 [employer].

## 2018-10-29 LAB — RPR: RPR Ser Ql: NONREACTIVE

## 2018-10-29 LAB — GLUCOSE, 1 HOUR GESTATIONAL: Gestational Diabetes Screen: 103 mg/dL (ref 65–139)

## 2018-11-09 ENCOUNTER — Other Ambulatory Visit: Payer: Self-pay

## 2018-11-09 ENCOUNTER — Ambulatory Visit (INDEPENDENT_AMBULATORY_CARE_PROVIDER_SITE_OTHER): Payer: Medicaid Other | Admitting: Certified Nurse Midwife

## 2018-11-09 VITALS — BP 106/66 | HR 66 | Wt 145.1 lb

## 2018-11-09 DIAGNOSIS — R319 Hematuria, unspecified: Secondary | ICD-10-CM

## 2018-11-09 DIAGNOSIS — Z3482 Encounter for supervision of other normal pregnancy, second trimester: Secondary | ICD-10-CM

## 2018-11-09 LAB — POCT URINALYSIS DIPSTICK OB
Bilirubin, UA: NEGATIVE
Glucose, UA: NEGATIVE
Ketones, UA: NEGATIVE
Leukocytes, UA: NEGATIVE
Nitrite, UA: NEGATIVE
POC,PROTEIN,UA: NEGATIVE
Spec Grav, UA: 1.015 (ref 1.010–1.025)
Urobilinogen, UA: 0.2 E.U./dL
pH, UA: 5 (ref 5.0–8.0)

## 2018-11-09 NOTE — Progress Notes (Signed)
ROB doing well. Feels good movement. Discussed birth control after baby . State she has used nexplanon in the past but had too much bleeding. Is considering depo injection. Phamphlet given. Follow up 3 wks.   Doreene Burke, CNM

## 2018-11-09 NOTE — Addendum Note (Signed)
Addended by: Brooke Dare on: 11/09/2018 11:55 AM   Modules accepted: Orders

## 2018-11-09 NOTE — Patient Instructions (Signed)
How a Baby Grows During Pregnancy    Pregnancy begins when a female's sperm enters a female's egg (fertilization). Fertilization usually happens in one of the tubes (fallopian tubes) that connect the ovaries to the womb (uterus). The fertilized egg moves down the fallopian tube to the uterus. Once it reaches the uterus, it implants into the lining of the uterus and begins to grow.  For the first 10 weeks, the fertilized egg is called an embryo. After 10 weeks, it is called a fetus. As the fetus continues to grow, it receives oxygen and nutrients through tissue (placenta) that grows to support the developing baby. The placenta is the life support system for the baby. It provides oxygen and nutrition and removes waste.  Learning as much as you can about your pregnancy and how your baby is developing can help you enjoy the experience. It can also make you aware of when there might be a problem and when to ask questions.  How long does a typical pregnancy last?  A pregnancy usually lasts 280 days, or about 40 weeks. Pregnancy is divided into three periods of growth, also called trimesters:   First trimester: 0-12 weeks.   Second trimester: 13-27 weeks.   Third trimester: 28-40 weeks.  The day when your baby is ready to be born (full term) is your estimated date of delivery.  How does my baby develop month by month?  First month   The fertilized egg attaches to the inside of the uterus.   Some cells will form the placenta. Others will form the fetus.   The arms, legs, brain, spinal cord, lungs, and heart begin to develop.   At the end of the first month, the heart begins to beat.  Second month   The bones, inner ear, eyelids, hands, and feet form.   The genitals develop.   By the end of 8 weeks, all major organs are developing.  Third month   All of the internal organs are forming.   Teeth develop below the gums.   Bones and muscles begin to grow. The spine can flex.   The skin is transparent.   Fingernails  and toenails begin to form.   Arms and legs continue to grow longer, and hands and feet develop.   The fetus is about 3 inches (7.6 cm) long.  Fourth month   The placenta is completely formed.   The external sex organs, neck, outer ear, eyebrows, eyelids, and fingernails are formed.   The fetus can hear, swallow, and move its arms and legs.   The kidneys begin to produce urine.   The skin is covered with a white, waxy coating (vernix) and very fine hair (lanugo).  Fifth month   The fetus moves around more and can be felt for the first time (quickening).   The fetus starts to sleep and wake up and may begin to suck its finger.   The nails grow to the end of the fingers.   The organ in the digestive system that makes bile (gallbladder) functions and helps to digest nutrients.   If your baby is a girl, eggs are present in her ovaries. If your baby is a boy, testicles start to move down into his scrotum.  Sixth month   The lungs are formed.   The eyes open. The brain continues to develop.   Your baby has fingerprints and toe prints. Your baby's hair grows thicker.   At the end of the second trimester, the   fetus is about 9 inches (22.9 cm) long.  Seventh month   The fetus kicks and stretches.   The eyes are developed enough to sense changes in light.   The hands can make a grasping motion.   The fetus responds to sound.  Eighth month   All organs and body systems are fully developed and functioning.   Bones harden, and taste buds develop. The fetus may hiccup.   Certain areas of the brain are still developing. The skull remains soft.  Ninth month   The fetus gains about  lb (0.23 kg) each week.   The lungs are fully developed.   Patterns of sleep develop.   The fetus's head typically moves into a head-down position (vertex) in the uterus to prepare for birth.   The fetus weighs 6-9 lb (2.72-4.08 kg) and is 19-20 inches (48.26-50.8 cm) long.  What can I do to have a healthy pregnancy and help  my baby develop?  General instructions   Take prenatal vitamins as directed by your health care provider. These include vitamins such as folic acid, iron, calcium, and vitamin D. They are important for healthy development.   Take medicines only as directed by your health care provider. Read labels and ask a pharmacist or your health care provider whether over-the-counter medicines, supplements, and prescription drugs are safe to take during pregnancy.   Keep all follow-up visits as directed by your health care provider. This is important. Follow-up visits include prenatal care and screening tests.  How do I know if my baby is developing well?  At each prenatal visit, your health care provider will do several different tests to check on your health and keep track of your baby's development. These include:   Fundal height and position.  ? Your health care provider will measure your growing belly from your pubic bone to the top of the uterus using a tape measure.  ? Your health care provider will also feel your belly to determine your baby's position.   Heartbeat.  ? An ultrasound in the first trimester can confirm pregnancy and show a heartbeat, depending on how far along you are.  ? Your health care provider will check your baby's heart rate at every prenatal visit.   Second trimester ultrasound.  ? This ultrasound checks your baby's development. It also may show your baby's gender.  What should I do if I have concerns about my baby's development?  Always talk with your health care provider about any concerns that you may have about your pregnancy and your baby.  Summary   A pregnancy usually lasts 280 days, or about 40 weeks. Pregnancy is divided into three periods of growth, also called trimesters.   Your health care provider will monitor your baby's growth and development throughout your pregnancy.   Follow your health care provider's recommendations about taking prenatal vitamins and medicines during  your pregnancy.   Talk with your health care provider if you have any concerns about your pregnancy or your developing baby.  This information is not intended to replace advice given to you by your health care provider. Make sure you discuss any questions you have with your health care provider.  Document Released: 11/25/2007 Document Revised: 04/21/2017 Document Reviewed: 04/21/2017  Elsevier Interactive Patient Education  2019 Elsevier Inc.

## 2018-11-11 LAB — URINE CULTURE: Organism ID, Bacteria: NO GROWTH

## 2018-11-25 ENCOUNTER — Encounter: Payer: Medicaid Other | Admitting: Certified Nurse Midwife

## 2018-11-30 ENCOUNTER — Other Ambulatory Visit: Payer: Self-pay

## 2018-11-30 ENCOUNTER — Ambulatory Visit (INDEPENDENT_AMBULATORY_CARE_PROVIDER_SITE_OTHER): Payer: Medicaid Other | Admitting: Obstetrics and Gynecology

## 2018-11-30 VITALS — BP 114/55 | HR 77 | Wt 146.2 lb

## 2018-11-30 DIAGNOSIS — Z3493 Encounter for supervision of normal pregnancy, unspecified, third trimester: Secondary | ICD-10-CM

## 2018-11-30 LAB — POCT URINALYSIS DIPSTICK OB
Bilirubin, UA: NEGATIVE
Glucose, UA: NEGATIVE
Ketones, UA: NEGATIVE
Leukocytes, UA: NEGATIVE
Nitrite, UA: NEGATIVE
POC,PROTEIN,UA: NEGATIVE
Spec Grav, UA: 1.01 (ref 1.010–1.025)
Urobilinogen, UA: 0.2 E.U./dL
pH, UA: 6.5 (ref 5.0–8.0)

## 2018-11-30 NOTE — Progress Notes (Signed)
ROB- doing well, cultures next visit.covid restrictions discussed.

## 2018-11-30 NOTE — Progress Notes (Signed)
ROB- pt is doing well 

## 2018-12-07 ENCOUNTER — Telehealth: Payer: Self-pay

## 2018-12-07 NOTE — Telephone Encounter (Signed)
LOV 05/06/18. Per Dr. Loanne Drilling, f/u after you are finished breast feeding. Called pt to inquire about breastfeeding and to schedule f/u appt if not. LVM using [employer] requesting returned call.

## 2018-12-12 ENCOUNTER — Telehealth: Payer: Self-pay

## 2018-12-12 NOTE — Telephone Encounter (Signed)
Coronavirus (COVID-19) Are you at risk?  Are you at risk for the Coronavirus (COVID-19)?  To be considered HIGH RISK for Coronavirus (COVID-19), you have to meet the following criteria:  . Traveled to [place], [place], [place], [place] or [place]; or in the [place] to [place], [place], [place], or [place]; and have fever, cough, and shortness of breath within the last 2 weeks of travel OR . Been in close contact with a person diagnosed with COVID-19 within the last 2 weeks and have fever, cough, and shortness of breath . IF YOU DO NOT MEET THESE CRITERIA, YOU ARE CONSIDERED LOW RISK FOR COVID-19.  What to do if you are HIGH RISK for COVID-19?  . If you are having a medical emergency, call 911. . Seek medical care right away. Before you go to a doctor's office, urgent care or emergency department, call ahead and tell them about your recent travel, contact with someone diagnosed with COVID-19, and your symptoms. You should receive instructions from your physician's office regarding next steps of care.  . When you arrive at healthcare provider, tell the healthcare staff immediately you have returned from visiting [place], [place], [place], [place] or [place]; or traveled in the [place] to [place], [place], [place], or [place]; in the last two weeks or you have been in close contact with a person diagnosed with COVID-19 in the last 2 weeks.   . Tell the health care staff about your symptoms: fever, cough and shortness of breath. . After you have been seen by a medical provider, you will be either: o Tested for (COVID-19) and discharged home on quarantine except to seek medical care if symptoms worsen, and asked to  - Stay home and avoid contact with others until you get your results (4-5 days)  - Avoid travel on public transportation if possible (such as bus, train, or airplane) or o Sent to the Emergency Department by EMS for evaluation, COVID-19 testing, and possible  admission depending on your condition and test results.  What to do if you are LOW RISK for COVID-19?  Reduce your risk of any infection by using the same precautions used for avoiding the common cold or flu:  . Wash your hands often with soap and warm water for at least 20 seconds.  If soap and water are not readily available, use an alcohol-based hand sanitizer with at least 60% alcohol.  . If coughing or sneezing, cover your mouth and nose by coughing or sneezing into the elbow areas of your shirt or coat, into a tissue or into your sleeve (not your hands). . Avoid shaking hands with others and consider head nods or verbal greetings only. . Avoid touching your eyes, nose, or mouth with unwashed hands.  . Avoid close contact with people who are sick. . Avoid places or events with large numbers of people in one location, like concerts or sporting events. . Carefully consider travel plans you have or are making. . If you are planning any travel outside or inside the [place], visit the CDC's Travelers' Health webpage for the latest health notices. . If you have some symptoms but not all symptoms, continue to monitor at home and seek medical attention if your symptoms worsen. . If you are having a medical emergency, call 911.   ADDITIONAL HEALTHCARE OPTIONS FOR PATIENTS  [facility] / e-Visit: [email or web address]         [facility]: [phone]  [facility]: [phone]                   [facility]: [phone]   Pre-screen negative, DM.   

## 2018-12-13 ENCOUNTER — Ambulatory Visit (INDEPENDENT_AMBULATORY_CARE_PROVIDER_SITE_OTHER): Payer: Medicaid Other | Admitting: Certified Nurse Midwife

## 2018-12-13 ENCOUNTER — Other Ambulatory Visit: Payer: Self-pay

## 2018-12-13 VITALS — BP 114/68 | HR 63 | Wt 147.9 lb

## 2018-12-13 DIAGNOSIS — Z3493 Encounter for supervision of normal pregnancy, unspecified, third trimester: Secondary | ICD-10-CM

## 2018-12-13 DIAGNOSIS — Z3685 Encounter for antenatal screening for Streptococcus B: Secondary | ICD-10-CM

## 2018-12-13 DIAGNOSIS — Z113 Encounter for screening for infections with a predominantly sexual mode of transmission: Secondary | ICD-10-CM

## 2018-12-13 LAB — POCT URINALYSIS DIPSTICK OB
Bilirubin, UA: NEGATIVE
Blood, UA: NEGATIVE
Glucose, UA: NEGATIVE
Ketones, UA: NEGATIVE
Leukocytes, UA: NEGATIVE
Nitrite, UA: NEGATIVE
Spec Grav, UA: 1.02 (ref 1.010–1.025)
Urobilinogen, UA: 0.2 E.U./dL
pH, UA: 6 (ref 5.0–8.0)

## 2018-12-13 LAB — OB RESULTS CONSOLE GC/CHLAMYDIA: Gonorrhea: NEGATIVE

## 2018-12-13 NOTE — Progress Notes (Signed)
ROB-Doing well, no questions or concerns. Desires Nexplanon as contraception. 36 wk cultures collected. Herbal prep handout given. Anticipatory guidance regarding course of prenatal care. Reviewed red flag symptoms and when to call. RTC x 1 wk for ROB or sooner if needed.

## 2018-12-13 NOTE — Patient Instructions (Signed)

## 2018-12-13 NOTE — Progress Notes (Signed)
ROB-No complaints.  

## 2018-12-13 NOTE — Addendum Note (Signed)
Addended by: Edrick Oh J on: 12/13/2018 11:59 AM   Modules accepted: Orders

## 2018-12-15 LAB — GC/CHLAMYDIA PROBE AMP
Chlamydia trachomatis, NAA: NEGATIVE
Neisseria Gonorrhoeae by PCR: NEGATIVE

## 2018-12-15 LAB — STREP GP B NAA: Strep Gp B NAA: NEGATIVE

## 2018-12-20 ENCOUNTER — Encounter: Payer: Self-pay | Admitting: Certified Nurse Midwife

## 2018-12-20 ENCOUNTER — Ambulatory Visit (INDEPENDENT_AMBULATORY_CARE_PROVIDER_SITE_OTHER): Payer: Medicaid Other | Admitting: Certified Nurse Midwife

## 2018-12-20 ENCOUNTER — Other Ambulatory Visit: Payer: Self-pay

## 2018-12-20 VITALS — BP 106/69 | HR 71 | Wt 150.5 lb

## 2018-12-20 DIAGNOSIS — Z3493 Encounter for supervision of normal pregnancy, unspecified, third trimester: Secondary | ICD-10-CM

## 2018-12-20 LAB — POCT URINALYSIS DIPSTICK OB
Bilirubin, UA: NEGATIVE
Blood, UA: NEGATIVE
Glucose, UA: NEGATIVE
Ketones, UA: NEGATIVE
Leukocytes, UA: NEGATIVE
Nitrite, UA: NEGATIVE
POC,PROTEIN,UA: NEGATIVE
Spec Grav, UA: 1.015 (ref 1.010–1.025)
Urobilinogen, UA: 0.2 E.U./dL
pH, UA: 5 (ref 5.0–8.0)

## 2018-12-20 NOTE — Progress Notes (Signed)
ROB doing well. Feels good movement. Has irregular contractions. Labor precautions reviewed. Follow up 1 wk.   Kanitra Purifoy, CNM  

## 2018-12-20 NOTE — Patient Instructions (Signed)
Braxton Hicks Contractions Contractions of the uterus can occur throughout pregnancy, but they are not always a sign that you are in labor. You may have practice contractions called Braxton Hicks contractions. These false labor contractions are sometimes confused with true labor. What are Braxton Hicks contractions? Braxton Hicks contractions are tightening movements that occur in the muscles of the uterus before labor. Unlike true labor contractions, these contractions do not result in opening (dilation) and thinning of the cervix. Toward the end of pregnancy (32-34 weeks), Braxton Hicks contractions can happen more often and may become stronger. These contractions are sometimes difficult to tell apart from true labor because they can be very uncomfortable. You should not feel embarrassed if you go to the hospital with false labor. Sometimes, the only way to tell if you are in true labor is for your health care provider to look for changes in the cervix. The health care provider will do a physical exam and may monitor your contractions. If you are not in true labor, the exam should show that your cervix is not dilating and your water has not broken. If there are no other health problems associated with your pregnancy, it is completely safe for you to be sent home with false labor. You may continue to have Braxton Hicks contractions until you go into true labor. How to tell the difference between true labor and false labor True labor  Contractions last 30-70 seconds.  Contractions become very regular.  Discomfort is usually felt in the top of the uterus, and it spreads to the lower abdomen and low back.  Contractions do not go away with walking.  Contractions usually become more intense and increase in frequency.  The cervix dilates and gets thinner. False labor  Contractions are usually shorter and not as strong as true labor contractions.  Contractions are usually irregular.  Contractions  are often felt in the front of the lower abdomen and in the groin.  Contractions may go away when you walk around or change positions while lying down.  Contractions get weaker and are shorter-lasting as time goes on.  The cervix usually does not dilate or become thin. Follow these instructions at home:   Take over-the-counter and prescription medicines only as told by your health care provider.  Keep up with your usual exercises and follow other instructions from your health care provider.  Eat and drink lightly if you think you are going into labor.  If Braxton Hicks contractions are making you uncomfortable: ? Change your position from lying down or resting to walking, or change from walking to resting. ? Sit and rest in a tub of warm water. ? Drink enough fluid to keep your urine pale yellow. Dehydration may cause these contractions. ? Do slow and deep breathing several times an hour.  Keep all follow-up prenatal visits as told by your health care provider. This is important. Contact a health care provider if:  You have a fever.  You have continuous pain in your abdomen. Get help right away if:  Your contractions become stronger, more regular, and closer together.  You have fluid leaking or gushing from your vagina.  You pass blood-tinged mucus (bloody show).  You have bleeding from your vagina.  You have low back pain that you never had before.  You feel your baby's head pushing down and causing pelvic pressure.  Your baby is not moving inside you as much as it used to. Summary  Contractions that occur before labor are   called Braxton Hicks contractions, false labor, or practice contractions.  Braxton Hicks contractions are usually shorter, weaker, farther apart, and less regular than true labor contractions. True labor contractions usually become progressively stronger and regular, and they become more frequent.  Manage discomfort from Braxton Hicks contractions  by changing position, resting in a warm bath, drinking plenty of water, or practicing deep breathing. This information is not intended to replace advice given to you by your health care provider. Make sure you discuss any questions you have with your health care provider. Document Released: 10/22/2016 Document Revised: 05/21/2017 Document Reviewed: 10/22/2016 Elsevier Patient Education  2020 Elsevier Inc.  

## 2019-01-03 ENCOUNTER — Other Ambulatory Visit: Payer: Self-pay

## 2019-01-03 ENCOUNTER — Ambulatory Visit (INDEPENDENT_AMBULATORY_CARE_PROVIDER_SITE_OTHER): Payer: Medicaid Other | Admitting: Obstetrics and Gynecology

## 2019-01-03 VITALS — BP 108/65 | HR 55 | Wt 151.9 lb

## 2019-01-03 DIAGNOSIS — Z3493 Encounter for supervision of normal pregnancy, unspecified, third trimester: Secondary | ICD-10-CM

## 2019-01-03 LAB — POCT URINALYSIS DIPSTICK OB
Bilirubin, UA: NEGATIVE
Blood, UA: NEGATIVE
Glucose, UA: NEGATIVE
Ketones, UA: NEGATIVE
Leukocytes, UA: NEGATIVE
Nitrite, UA: NEGATIVE
POC,PROTEIN,UA: NEGATIVE
Spec Grav, UA: 1.015 (ref 1.010–1.025)
Urobilinogen, UA: 0.2 E.U./dL
pH, UA: 6 (ref 5.0–8.0)

## 2019-01-03 NOTE — Progress Notes (Signed)
ROB- doing well. Labor and post-dates discussed. Will plan NST at next visit if not delivered. Pt desires nexplanon for [facility] postpartum.

## 2019-01-03 NOTE — Patient Instructions (Signed)
Cardiotocografa en reposo Nonstress Test La cardiotocografa en reposo es un procedimiento que se realiza durante el embarazo a fin de Chief Technology Officer los latidos cardacos del beb. Este procedimiento muestra si el beb (feto) est sano. Con frecuencia, se realiza si:  Pas la fecha prevista de parto.  El [place] es de alto riesgo.  El beb se mueve menos de lo normal.  La madre perdi un embarazo anteriormente.  El mdico sospecha que hay un problema con el crecimiento del beb.  Hay muy poco o demasiado lquido [employer]. El procedimiento a menudo se Sport and exercise psychologist trimestre del Media planner para determinar si es necesario un parto prematuro y si tal parto es seguro. Durante la cardiotocografa en reposo, los latidos cardacos del beb se monitorean cuando el beb descansa y cuando se est moviendo. Si el beb est sano, la frecuencia cardaca aumentar cuando se mueva o patee, y volver a su estado normal cuando est en reposo. Informe al mdico acerca de lo siguiente:  Cualquier alergia que tenga.  Cualquier enfermedad que tenga.  Todos los [employer], incluidos vitaminas, hierbas, gotas oftlmicas, cremas y medicamentos de venta libre. Cules son los riesgos? La cardiotocografa en reposo no implica riesgos ni para usted ni para el beb. Este procedimiento no debe ser doloroso ni incmodo. Qu ocurre antes del procedimiento?  Ingiera una comida justo antes de la prueba o segn se lo haya indicado el mdico. La comida puede estimular al beb para que se mueva.  Vaya al bao justo antes de la prueba. Qu ocurre durante el procedimiento?  Se le colocarn dos monitores en el abdomen. Uno registrar la frecuencia cardaca del beb y el otro, las contracciones del tero.  Se le pedir que se recueste sobre un costado o que se siente derecha.  Probablemente, le proporcionen un botn que deber presionar cuando sienta que el beb se mueve.  El mdico escuchar los  latidos cardacos del beb y los grabar. Tambin podr ver los latidos cardacos en una pantalla.  Si el beb parece estar dormido, se le pedir que tome jugo o un refresco, coma un refrigerio o cambie de posicin. Este procedimiento puede variar segn el mdico y el hospital. Sander Nephew sucede despus del procedimiento?  El mdico [employer] de la prueba con usted y le dar recomendaciones para el futuro. Segn los [place] mdico podr solicitar pruebas adicionales o tomar otras medidas.  Si el [employer] dio indicaciones sobre alguna dieta o [place], asegrese de cumplirlas.  Concurra a todas las visitas de control como se lo haya indicado el mdico. Esto es importante. Resumen  La cardiotocografa en reposo es un procedimiento que se realiza durante el embarazo a fin de Chief Technology Officer los latidos cardacos del beb. El procedimiento muestra si el beb est sano.  El procedimiento a menudo se Sport and exercise psychologist trimestre del Media planner para determinar si es necesario un parto prematuro y si tal parto es seguro.  Durante la cardiotocografa en reposo, los latidos cardacos del beb se monitorean cuando el beb descansa y cuando se est moviendo. Si el beb est sano, la frecuencia cardaca aumentar cuando se mueva o patee, y volver a su estado normal cuando est en reposo.  El mdico [employer] de la prueba con usted y le dar recomendaciones para el futuro. Esta informacin no tiene Marine scientist el consejo del mdico. Asegrese de hacerle al mdico cualquier pregunta que tenga. Document Released: 06/08/2005 Document Revised: 03/08/2017 Document Reviewed: 03/08/2017 Elsevier  Patient Education  [employer].

## 2019-01-03 NOTE — Progress Notes (Signed)
ROB- pt is doing well, having some pelvic pressure

## 2019-01-10 ENCOUNTER — Other Ambulatory Visit: Payer: Self-pay

## 2019-01-10 ENCOUNTER — Ambulatory Visit (INDEPENDENT_AMBULATORY_CARE_PROVIDER_SITE_OTHER): Payer: Medicaid Other | Admitting: Certified Nurse Midwife

## 2019-01-10 ENCOUNTER — Other Ambulatory Visit: Payer: Medicaid Other

## 2019-01-10 VITALS — BP 120/65 | HR 71 | Wt 153.5 lb

## 2019-01-10 DIAGNOSIS — Z3493 Encounter for supervision of normal pregnancy, unspecified, third trimester: Secondary | ICD-10-CM

## 2019-01-10 DIAGNOSIS — O480 Post-term pregnancy: Secondary | ICD-10-CM

## 2019-01-10 LAB — POCT URINALYSIS DIPSTICK OB
Bilirubin, UA: NEGATIVE
Glucose, UA: NEGATIVE
Ketones, UA: NEGATIVE
Leukocytes, UA: NEGATIVE
Nitrite, UA: NEGATIVE
POC,PROTEIN,UA: NEGATIVE
Spec Grav, UA: 1.01 (ref 1.010–1.025)
Urobilinogen, UA: 0.2 E.U./dL
pH, UA: 6.5 (ref 5.0–8.0)

## 2019-01-10 NOTE — Progress Notes (Signed)
ROB and NST-Patient c/o intermittent pelvic and vaginal pressure. Requests SVE. Education regarding postdates care. Herbal prep handout provided. Reviewed red flag symptoms and when to call. RTC x Friday for NST and AFI or sooner if needed.   NONSTRESS TEST INTERPRETATION  INDICATIONS: Postdates pregnancy  FHR baseline: 125 bpm RESULTS:Reactive COMMENTS: Irregular contractions   PLAN: 1. Continue fetal kick counts twice a day. 2. Continue antepartum testing as scheduled-Biweekly 3. Reviewed red flag symptoms and when to call   Diona Fanti, CNM [facility], [facility] 01/10/19 10:49 AM

## 2019-01-10 NOTE — Patient Instructions (Signed)
Fetal Movement Counts Patient Name: ________________________________________________ Patient Due Date: ____________________ What is a fetal movement count?  A fetal movement count is the number of times that you feel your baby move during a certain amount of time. This may also be called a fetal kick count. A fetal movement count is recommended for every pregnant woman. You may be asked to start counting fetal movements as early as week 28 of your pregnancy. Pay attention to when your baby is most active. You may notice your baby's sleep and wake cycles. You may also notice things that make your baby move more. You should do a fetal movement count:  When your baby is normally most active.  At the same time each day. A good time to count movements is while you are resting, after having something to eat and drink. How do I count fetal movements? 1. Find a quiet, comfortable area. Sit, or lie down on your side. 2. Write down the date, the start time and stop time, and the number of movements that you felt between those two times. Take this information with you to your health care visits. 3. For 2 hours, count kicks, flutters, swishes, rolls, and jabs. You should feel at least 10 movements during 2 hours. 4. You may stop counting after you have felt 10 movements. 5. If you do not feel 10 movements in 2 hours, have something to eat and drink. Then, keep resting and counting for 1 hour. If you feel at least 4 movements during that hour, you may stop counting. Contact a health care provider if:  You feel fewer than 4 movements in 2 hours.  Your baby is not moving like he or she usually does. Date: ____________ Start time: ____________ Stop time: ____________ Movements: ____________ Date: ____________ Start time: ____________ Stop time: ____________ Movements: ____________ Date: ____________ Start time: ____________ Stop time: ____________ Movements: ____________ Date: ____________ Start time:  ____________ Stop time: ____________ Movements: ____________ Date: ____________ Start time: ____________ Stop time: ____________ Movements: ____________ Date: ____________ Start time: ____________ Stop time: ____________ Movements: ____________ Date: ____________ Start time: ____________ Stop time: ____________ Movements: ____________ Date: ____________ Start time: ____________ Stop time: ____________ Movements: ____________ Date: ____________ Start time: ____________ Stop time: ____________ Movements: ____________ This information is not intended to replace advice given to you by your health care provider. Make sure you discuss any questions you have with your health care provider. Document Released: 07/08/2006 Document Revised: 06/28/2018 Document Reviewed: 07/18/2015 Elsevier Patient Education  2020 [employer] Nonstress Test A nonstress test is a procedure that is done during pregnancy in order to check the baby's heartbeat. The procedure can help show if the baby (fetus) is healthy. It is commonly done when:  The baby is past his or her due date.  The pregnancy is high risk.  The baby is moving less than normal.  The mother has lost a pregnancy in the past.  The health care provider suspects a problem with the baby's growth.  There is too much or too little amniotic fluid. The procedure is often done in the third trimester of pregnancy to find out if an early delivery is needed and whether such a delivery is safe. During a nonstress test, the baby's heartbeat is monitored when the baby is resting and when the baby is moving. If the baby is healthy, the heart rate will increase when he or she moves or kicks and will return to normal when he or she rests. Tell a health care   provider about:  Any allergies you have.  Any medical conditions you have.  All medicines you are taking, including vitamins, herbs, eye drops, creams, and over-the-counter medicines. What are the risks?  There are no risks to you or your baby from a nonstress test. This procedure should not be painful or uncomfortable. What happens before the procedure?  Eat a meal right before the test or as directed by your health care provider. Food may help encourage the baby to move.  Use the restroom right before the test. What happens during the procedure?  Two monitors will be placed on your abdomen. One will record the baby's heart rate and the other will record the contractions of your uterus.  You may be asked to lie down on your side or to sit upright.  You may be given a button to press when you feel your baby move.  Your health care provider will listen to your baby's heartbeat and recorded it. He or she may also watch your baby's heartbeat on a screen.  If the baby seems to be sleeping, you may be asked to drink some juice or soda, eat a snack, or change positions. The procedure may vary among health care providers and hospitals. What happens after the procedure?  Your health care provider will discuss the test results with you and make recommendations for the future. Depending on the results, your health care provider may order additional tests or another course of action.  If your health care provider gave you any diet or activity instructions, make sure to follow them.  Keep all follow-up visits as told by your health care provider. This is important. Summary  A nonstress test is a procedure that is done during pregnancy in order to check the baby's heartbeat. The procedure can help show if the baby is healthy.  The procedure is often done in the third trimester of pregnancy to find out if an early delivery is needed and whether such a delivery is safe.  During a nonstress test, the baby's heartbeat is monitored when the baby is resting and when the baby is moving. If the baby is healthy, the heart rate will increase when he or she moves or kicks and will return to normal when he or  she rests.  Your health care provider will discuss the test results with you and make recommendations for the future. This information is not intended to replace advice given to you by your health care provider. Make sure you discuss any questions you have with your health care provider. Document Released: 05/29/2002 Document Revised: 09/17/2016 Document Reviewed: 09/17/2016 Elsevier Patient Education  [place].

## 2019-01-11 ENCOUNTER — Telehealth: Payer: Self-pay | Admitting: Certified Nurse Midwife

## 2019-01-11 ENCOUNTER — Other Ambulatory Visit: Payer: Self-pay

## 2019-01-11 DIAGNOSIS — Z20828 Contact with and (suspected) exposure to other viral communicable diseases: Secondary | ICD-10-CM

## 2019-01-11 DIAGNOSIS — Z20822 Contact with and (suspected) exposure to covid-19: Secondary | ICD-10-CM

## 2019-01-11 NOTE — Telephone Encounter (Signed)
Pt called and stated that she has been in direct contact with her friend that has tested positive for COVID-19. Pt was advised by nurse on 01/11/19 that orders are put in and pt is to go and get tested 01/12/19, and also that apt's will be canceled until test is negative. The patient voiced understanding and is aware not to come into office. Thank you.

## 2019-01-12 ENCOUNTER — Telehealth: Payer: Self-pay | Admitting: Certified Nurse Midwife

## 2019-01-12 ENCOUNTER — Other Ambulatory Visit: Payer: Self-pay

## 2019-01-12 ENCOUNTER — Observation Stay
Admission: EM | Admit: 2019-01-12 | Discharge: 2019-01-12 | Disposition: A | Discharge status: Home / Self Care | Payer: Medicaid Other | Attending: Certified Nurse Midwife | Admitting: Certified Nurse Midwife

## 2019-01-12 ENCOUNTER — Observation Stay: Payer: Medicaid Other

## 2019-01-12 ENCOUNTER — Encounter: Payer: Self-pay | Admitting: Certified Nurse Midwife

## 2019-01-12 DIAGNOSIS — O480 Post-term pregnancy: Secondary | ICD-10-CM

## 2019-01-12 DIAGNOSIS — Z3492 Encounter for supervision of normal pregnancy, unspecified, second trimester: Secondary | ICD-10-CM

## 2019-01-12 DIAGNOSIS — O288 Other abnormal findings on antenatal screening of mother: Secondary | ICD-10-CM

## 2019-01-12 DIAGNOSIS — O368130 Decreased fetal movements, third trimester, not applicable or unspecified: Secondary | ICD-10-CM | POA: Diagnosis not present

## 2019-01-12 DIAGNOSIS — O368121 Decreased fetal movements, second trimester, fetus 1: Secondary | ICD-10-CM | POA: Diagnosis not present

## 2019-01-12 DIAGNOSIS — Z3689 Encounter for other specified antenatal screening: Secondary | ICD-10-CM

## 2019-01-12 DIAGNOSIS — Z20822 Contact with and (suspected) exposure to covid-19: Secondary | ICD-10-CM

## 2019-01-12 DIAGNOSIS — Z3A40 40 weeks gestation of pregnancy: Secondary | ICD-10-CM | POA: Insufficient documentation

## 2019-01-12 NOTE — Telephone Encounter (Signed)
The patient husband called and stated that he needs a call back in regards to what the the patient is to do moving forward for appointments. Pt and husband were exposed to [employer] put in orders yesterday for pt to get tested today. Pt was advised to not come into office. Please advise, and review message sent back yesterday. Thank you.

## 2019-01-12 NOTE — OB Triage Note (Signed)
Pt. Presented to L/D triage with complaint of no detected fetal movement since 0300. FHT 135. She reports no bleeding or LOF.  VSS. Will continue to monitor.

## 2019-01-12 NOTE — OB Triage Note (Signed)
   L&D OB Triage Note  SUBJECTIVE Cristina Weber is a 33 y.o. G60P0020 female at [redacted]w[redacted]d, EDD Estimated Date of Delivery: 01/10/19 who presented to triage with complaints of decreased fetal movement.   OB History  Gravida Para Term Preterm AB Living  3 0 0 0 2 0  SAB TAB Ectopic Multiple Live Births  1 0 0 0 0    # Outcome Date GA Lbr Len/2nd Weight Sex Delivery Anes PTL Lv  3 Current           2 AB 2011          1 SAB             Medications Prior to Admission  Medication Sig Dispense Refill Last Dose  . Prenatal Vit-Fe Fumarate-FA (PRENATAL MULTIVITAMIN) TABS tablet Take 1 tablet by mouth daily at 12 noon.   01/12/2019 at Unknown time     OBJECTIVE  Nursing Evaluation:   BP 135/73 (BP Location: Right Arm)   Pulse 78   Temp 97.9 F (36.6 C) (Oral)   Resp 16   Ht 4\' 9"  (1.448 m)   Wt 69.4 kg   LMP 04/06/2018 (Exact Date)   BMI 33.11 kg/m    Findings:  Reactive NST  NST was performed and has been reviewed by me.  NST INTERPRETATION: Category I  Mode: External Baseline Rate (A): 128 bpm(fht) Variability: Minimal, Moderate Accelerations: 15 x 15 Decelerations: None     Contraction Frequency (min): 4-6 w/ ui  BPP: 8/8 AFI: single deepest pocket greater than 2 cm.   ASSESSMENT Impression:  1.  Pregnancy:  G3P0020 at [redacted]w[redacted]d , EDD Estimated Date of Delivery: 01/10/19 2.  NST:  Category I  PLAN 1. Reassurance given, Fetal Kick counts reveiwed.  2. Discharge home with standard labor precautions given to return to L&D or call the office for problems. 3. Continue routine prenatal care. Return Sunday [facility] for induction   $. Dr. Amalia Hailey reviewed BPP scan prior to discharge home.   Philip Aspen, CNM

## 2019-01-12 NOTE — Telephone Encounter (Signed)
MyChart message forwarded to [facility] for response.

## 2019-01-13 ENCOUNTER — Other Ambulatory Visit
Admission: RE | Admit: 2019-01-13 | Discharge: 2019-01-13 | Disposition: A | Discharge status: Home / Self Care | Payer: Medicaid Other | Source: Ambulatory Visit | Attending: Certified Nurse Midwife | Admitting: Certified Nurse Midwife

## 2019-01-13 ENCOUNTER — Other Ambulatory Visit: Payer: Medicaid Other

## 2019-01-13 ENCOUNTER — Encounter: Payer: Medicaid Other | Admitting: Certified Nurse Midwife

## 2019-01-13 ENCOUNTER — Other Ambulatory Visit: Payer: Medicaid Other | Admitting: Obstetrics and Gynecology

## 2019-01-14 ENCOUNTER — Inpatient Hospital Stay
Admission: EM | Admit: 2019-01-14 | Discharge: 2019-01-15 | DRG: 806 | Disposition: A | Discharge status: Home / Self Care | Payer: Medicaid Other | Attending: Certified Nurse Midwife | Admitting: Certified Nurse Midwife

## 2019-01-14 ENCOUNTER — Other Ambulatory Visit: Payer: Self-pay

## 2019-01-14 ENCOUNTER — Inpatient Hospital Stay: Payer: Medicaid Other | Admitting: Anesthesiology

## 2019-01-14 DIAGNOSIS — Z8759 Personal history of other complications of pregnancy, childbirth and the puerperium: Secondary | ICD-10-CM | POA: Diagnosis present

## 2019-01-14 DIAGNOSIS — Z3A40 40 weeks gestation of pregnancy: Secondary | ICD-10-CM | POA: Diagnosis not present

## 2019-01-14 DIAGNOSIS — O26893 Other specified pregnancy related conditions, third trimester: Secondary | ICD-10-CM | POA: Diagnosis present

## 2019-01-14 DIAGNOSIS — O480 Post-term pregnancy: Secondary | ICD-10-CM | POA: Diagnosis present

## 2019-01-14 DIAGNOSIS — O4103X0 Oligohydramnios, third trimester, not applicable or unspecified: Secondary | ICD-10-CM | POA: Diagnosis present

## 2019-01-14 DIAGNOSIS — Z1159 Encounter for screening for other viral diseases: Secondary | ICD-10-CM | POA: Diagnosis not present

## 2019-01-14 DIAGNOSIS — Z20822 Contact with and (suspected) exposure to covid-19: Secondary | ICD-10-CM | POA: Diagnosis present

## 2019-01-14 DIAGNOSIS — Z862 Personal history of diseases of the blood and blood-forming organs and certain disorders involving the immune mechanism: Secondary | ICD-10-CM

## 2019-01-14 DIAGNOSIS — Z20828 Contact with and (suspected) exposure to other viral communicable diseases: Secondary | ICD-10-CM | POA: Diagnosis present

## 2019-01-14 LAB — TYPE AND SCREEN
ABO/RH(D): O POS
Antibody Screen: NEGATIVE

## 2019-01-14 LAB — CBC
HCT: 40.3 % (ref 36.0–46.0)
Hemoglobin: 13.8 g/dL (ref 12.0–15.0)
MCH: 30.6 pg (ref 26.0–34.0)
MCHC: 34.2 g/dL (ref 30.0–36.0)
MCV: 89.4 fL (ref 80.0–100.0)
Platelets: 226 10*3/uL (ref 150–400)
RBC: 4.51 MIL/uL (ref 3.87–5.11)
RDW: 14.2 % (ref 11.5–15.5)
WBC: 14.8 10*3/uL — ABNORMAL HIGH (ref 4.0–10.5)
nRBC: 0 % (ref 0.0–0.2)

## 2019-01-14 LAB — SARS CORONAVIRUS 2 BY RT PCR (HOSPITAL ORDER, PERFORMED IN ~~LOC~~ HOSPITAL LAB): SARS Coronavirus 2: NEGATIVE

## 2019-01-14 MED ORDER — OXYTOCIN 40 UNITS IN NORMAL SALINE INFUSION - SIMPLE MED
1.0000 m[IU]/min | INTRAVENOUS | Status: DC
  Administered 2019-01-14: 2 m[IU]/min via INTRAVENOUS

## 2019-01-14 MED ORDER — ONDANSETRON HCL 4 MG/2ML IJ SOLN: 4.0000 mg | INTRAMUSCULAR | Status: DC | PRN

## 2019-01-14 MED ORDER — LACTATED RINGERS IV SOLN
500.0000 mL | INTRAVENOUS | Status: DC | PRN
  Administered 2019-01-14: 500 mL via INTRAVENOUS

## 2019-01-14 MED ORDER — PHENYLEPHRINE 40 MCG/ML (10ML) SYRINGE FOR IV PUSH (FOR BLOOD PRESSURE SUPPORT): 80.0000 ug | PREFILLED_SYRINGE | INTRAVENOUS | Status: DC | PRN

## 2019-01-14 MED ORDER — ACETAMINOPHEN 325 MG PO TABS
650.0000 mg | ORAL_TABLET | ORAL | Status: DC | PRN
  Administered 2019-01-15 (×2): 650 mg via ORAL
  Filled 2019-01-14 (×2): qty 2

## 2019-01-14 MED ORDER — LACTATED RINGERS AMNIOINFUSION
INTRAVENOUS | Status: DC
  Filled 2019-01-14 (×3): qty 1000

## 2019-01-14 MED ORDER — EPHEDRINE 5 MG/ML INJ: 10.0000 mg | INTRAVENOUS | Status: DC | PRN

## 2019-01-14 MED ORDER — OXYTOCIN BOLUS FROM INFUSION
500.0000 mL | Freq: Once | INTRAVENOUS | Status: DC
  Administered 2019-01-14: 500 mL via INTRAVENOUS

## 2019-01-14 MED ORDER — OXYCODONE-ACETAMINOPHEN 5-325 MG PO TABS: 2.0000 | ORAL_TABLET | ORAL | Status: DC | PRN

## 2019-01-14 MED ORDER — AMMONIA AROMATIC IN INHA
RESPIRATORY_TRACT | Status: AC
  Filled 2019-01-14: qty 10

## 2019-01-14 MED ORDER — FENTANYL 2.5 MCG/ML W/ROPIVACAINE 0.15% IN NS 100 ML EPIDURAL (ARMC)
EPIDURAL | Status: AC
  Filled 2019-01-14: qty 100

## 2019-01-14 MED ORDER — WITCH HAZEL-GLYCERIN EX PADS: 1.0000 "application " | MEDICATED_PAD | CUTANEOUS | Status: DC | PRN

## 2019-01-14 MED ORDER — TERBUTALINE SULFATE 1 MG/ML IJ SOLN
0.2500 mg | Freq: Once | INTRAMUSCULAR | Status: AC | PRN
  Administered 2019-01-14: 0.25 mg via SUBCUTANEOUS
  Filled 2019-01-14: qty 1

## 2019-01-14 MED ORDER — MISOPROSTOL 200 MCG PO TABS
ORAL_TABLET | ORAL | Status: AC
  Filled 2019-01-14: qty 4

## 2019-01-14 MED ORDER — SOD CITRATE-CITRIC ACID 500-334 MG/5ML PO SOLN
30.0000 mL | ORAL | Status: DC | PRN
  Filled 2019-01-14: qty 30

## 2019-01-14 MED ORDER — SENNOSIDES-DOCUSATE SODIUM 8.6-50 MG PO TABS
2.0000 | ORAL_TABLET | ORAL | Status: DC
  Administered 2019-01-15: 2 via ORAL
  Filled 2019-01-14: qty 2

## 2019-01-14 MED ORDER — ONDANSETRON HCL 4 MG/2ML IJ SOLN: 4.0000 mg | Freq: Four times a day (QID) | INTRAMUSCULAR | Status: DC | PRN

## 2019-01-14 MED ORDER — SIMETHICONE 80 MG PO CHEW: 80.0000 mg | CHEWABLE_TABLET | ORAL | Status: DC | PRN

## 2019-01-14 MED ORDER — LACTATED RINGERS IV SOLN
INTRAVENOUS | Status: DC
  Administered 2019-01-14: 08:00:00 via INTRAVENOUS

## 2019-01-14 MED ORDER — PRENATAL MULTIVITAMIN CH
1.0000 | ORAL_TABLET | Freq: Every day | ORAL | Status: DC
  Administered 2019-01-15: 1 via ORAL
  Filled 2019-01-14: qty 1

## 2019-01-14 MED ORDER — OXYTOCIN 40 UNITS IN NORMAL SALINE INFUSION - SIMPLE MED
2.5000 [IU]/h | INTRAVENOUS | Status: DC
  Administered 2019-01-14: 2.5 [IU]/h via INTRAVENOUS
  Filled 2019-01-14: qty 1000

## 2019-01-14 MED ORDER — LIDOCAINE-EPINEPHRINE (PF) 1.5 %-1:200000 IJ SOLN
INTRAMUSCULAR | Status: DC | PRN
  Administered 2019-01-14: 3 mL via EPIDURAL

## 2019-01-14 MED ORDER — SODIUM CHLORIDE 0.9% FLUSH: 3.0000 mL | Freq: Two times a day (BID) | INTRAVENOUS | Status: DC

## 2019-01-14 MED ORDER — BUTORPHANOL TARTRATE 2 MG/ML IJ SOLN: 1.0000 mg | INTRAMUSCULAR | Status: DC | PRN

## 2019-01-14 MED ORDER — SODIUM CHLORIDE 0.9 % IV SOLN
INTRAVENOUS | Status: DC | PRN
  Administered 2019-01-14 (×2): 5 mL via EPIDURAL

## 2019-01-14 MED ORDER — DIPHENHYDRAMINE HCL 50 MG/ML IJ SOLN: 12.5000 mg | INTRAMUSCULAR | Status: DC | PRN

## 2019-01-14 MED ORDER — OXYCODONE-ACETAMINOPHEN 5-325 MG PO TABS: 1.0000 | ORAL_TABLET | ORAL | Status: DC | PRN

## 2019-01-14 MED ORDER — METHYLERGONOVINE MALEATE 0.2 MG/ML IJ SOLN: 0.2000 mg | INTRAMUSCULAR | Status: DC | PRN

## 2019-01-14 MED ORDER — COCONUT OIL OIL: 1.0000 "application " | TOPICAL_OIL | Status: DC | PRN

## 2019-01-14 MED ORDER — IBUPROFEN 600 MG PO TABS
600.0000 mg | ORAL_TABLET | Freq: Four times a day (QID) | ORAL | Status: DC
  Administered 2019-01-14 – 2019-01-15 (×3): 600 mg via ORAL
  Filled 2019-01-14 (×3): qty 1

## 2019-01-14 MED ORDER — LIDOCAINE HCL (PF) 1 % IJ SOLN
INTRAMUSCULAR | Status: DC | PRN
  Administered 2019-01-14: 1 mL via INTRADERMAL

## 2019-01-14 MED ORDER — BENZOCAINE-MENTHOL 20-0.5 % EX AERO
1.0000 "application " | INHALATION_SPRAY | CUTANEOUS | Status: DC | PRN
  Filled 2019-01-14 (×2): qty 56

## 2019-01-14 MED ORDER — LIDOCAINE HCL (PF) 1 % IJ SOLN
30.0000 mL | INTRAMUSCULAR | Status: DC | PRN
  Filled 2019-01-14: qty 30

## 2019-01-14 MED ORDER — SODIUM CHLORIDE 0.9% FLUSH: 3.0000 mL | INTRAVENOUS | Status: DC | PRN

## 2019-01-14 MED ORDER — SODIUM CHLORIDE 0.9 % IV SOLN: 250.0000 mL | INTRAVENOUS | Status: DC | PRN

## 2019-01-14 MED ORDER — OXYTOCIN 10 UNIT/ML IJ SOLN
INTRAMUSCULAR | Status: AC
  Filled 2019-01-14: qty 2

## 2019-01-14 MED ORDER — ONDANSETRON HCL 4 MG PO TABS: 4.0000 mg | ORAL_TABLET | ORAL | Status: DC | PRN

## 2019-01-14 MED ORDER — DIPHENHYDRAMINE HCL 25 MG PO CAPS: 25.0000 mg | ORAL_CAPSULE | Freq: Four times a day (QID) | ORAL | Status: DC | PRN

## 2019-01-14 MED ORDER — DIBUCAINE (PERIANAL) 1 % EX OINT: 1.0000 "application " | TOPICAL_OINTMENT | CUTANEOUS | Status: DC | PRN

## 2019-01-14 MED ORDER — ACETAMINOPHEN 325 MG PO TABS: 650.0000 mg | ORAL_TABLET | ORAL | Status: DC | PRN

## 2019-01-14 MED ORDER — FENTANYL 2.5 MCG/ML W/ROPIVACAINE 0.15% IN NS 100 ML EPIDURAL (ARMC)
12.0000 mL/h | EPIDURAL | Status: DC
  Administered 2019-01-14 (×2): 12 mL/h via EPIDURAL
  Filled 2019-01-14: qty 100

## 2019-01-14 MED ORDER — LACTATED RINGERS IV SOLN
500.0000 mL | Freq: Once | INTRAVENOUS | Status: AC
  Administered 2019-01-14: 500 mL via INTRAVENOUS

## 2019-01-14 MED ORDER — METHYLERGONOVINE MALEATE 0.2 MG PO TABS
0.2000 mg | ORAL_TABLET | ORAL | Status: DC | PRN
  Filled 2019-01-14: qty 1

## 2019-01-14 MED ORDER — MISOPROSTOL 25 MCG QUARTER TABLET: 25.0000 ug | ORAL_TABLET | ORAL | Status: DC | PRN

## 2019-01-14 NOTE — Anesthesia Preprocedure Evaluation (Signed)
Anesthesia Evaluation  Patient identified by MRN, date of birth, ID band Patient awake    Reviewed: Allergy & Precautions, H&P , NPO status , Patient's Chart, lab work & pertinent test results  History of Anesthesia Complications Negative for: history of anesthetic complications  Airway Mallampati: III  TM Distance: >3 FB Neck ROM: full    Dental  (+) Chipped   Pulmonary neg pulmonary ROS,           Cardiovascular Exercise Tolerance: Good (-) hypertensionnegative cardio ROS       Neuro/Psych    GI/Hepatic negative GI ROS,   Endo/Other    Renal/GU   negative genitourinary   Musculoskeletal   Abdominal   Peds  Hematology negative hematology ROS (+)   Anesthesia Other Findings Past Medical History: No date: Bloody discharge from left nipple No date: Elevated prolactin level (HCC) No date: History of miscarriage  Past Surgical History: No date: NO PAST SURGERIES     Reproductive/Obstetrics (+) Pregnancy                             Anesthesia Physical Anesthesia Plan  ASA: II  Anesthesia Plan: Epidural   Post-op Pain Management:    Induction:   PONV Risk Score and Plan:   Airway Management Planned:   Additional Equipment:   Intra-op Plan:   Post-operative Plan:   Informed Consent: I have reviewed the patients History and Physical, chart, labs and discussed the procedure including the risks, benefits and alternatives for the proposed anesthesia with the patient or authorized representative who has indicated his/her understanding and acceptance.       Plan Discussed with: Anesthesiologist  Anesthesia Plan Comments:         Anesthesia Quick Evaluation
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
## 2019-01-14 NOTE — Anesthesia Procedure Notes (Signed)
Epidural Patient location during procedure: OB Start time: 01/14/2019 9:05 AM End time: 01/14/2019 9:08 AM  Staffing Anesthesiologist: Jionni Helming, Precious Haws, MD Performed: anesthesiologist   Preanesthetic Checklist Completed: patient identified, site marked, surgical consent, pre-op evaluation, timeout performed, IV checked, risks and benefits discussed and monitors and equipment checked  Epidural Patient position: sitting Prep: ChloraPrep Patient monitoring: heart rate, continuous pulse ox and blood pressure Approach: midline Location: L3-L4 Injection technique: LOR saline  Needle:  Needle type: Tuohy  Needle gauge: 17 G Needle length: 9 cm and 9 Needle insertion depth: 5 cm Catheter type: closed end flexible Catheter size: 19 Gauge Catheter at skin depth: 10 cm Test dose: negative and 1.5% lidocaine with Epi 1:200 K  Assessment Sensory level: T10 Events: blood not aspirated, injection not painful, no injection resistance, negative IV test and no paresthesia  Additional Notes I wore an N95 face mask and Face shield during all of my interactions with this patient.  Patient was moved from [facility] to [facility] before placement because her partner had been tested for COVID and his status was unknown.  I had no interactions with the patients partner and was never in the same room as him.  1 attempt Pt. Evaluated and documentation done after procedure finished. Patient identified. Risks/Benefits/Options discussed with patient including but not limited to bleeding, infection, nerve damage, paralysis, failed block, incomplete pain control, headache, blood pressure changes, nausea, vomiting, reactions to medication both or allergic, itching and postpartum back pain. Confirmed with bedside nurse the patient's most recent platelet count. Confirmed with patient that they are not currently taking any anticoagulation, have any bleeding history or any family history of bleeding disorders. Patient  expressed understanding and wished to proceed. All questions were answered. Sterile technique was used throughout the entire procedure. Please see nursing notes for vital signs. Test dose was given through epidural catheter and negative prior to continuing to dose epidural or start infusion. Warning signs of high block given to the patient including shortness of breath, tingling/numbness in hands, complete motor block, or any concerning symptoms with instructions to call for help. Patient was given instructions on fall risk and not to get out of bed. All questions and concerns addressed with instructions to call with any issues or inadequate analgesia.   Patient tolerated the insertion well without immediate complications.Reason for block:procedure for pain

## 2019-01-14 NOTE — Progress Notes (Signed)
In room to see patient due to prolonged deceleration noted on EFM. Pitocin infusion stopped. FSE applied and IUPC inserted without difficulty. Aminioinfusion started with 500 ml bolus, see orders. Terbutaline given. Patient placed in right side lying position. EFM tracing within normal limits. Discussed possible need to surgical birth with prolonged Category II tracing or persistent fetal bradycardia; spouse and patient both verbalized understanding. Update given to Dr. Marcelline Mates. Continue orders as written. Reassess as needed. Guarded for vaginal birth.    Diona Fanti, CNM [facility], [facility] 01/14/19 12:33 PM

## 2019-01-14 NOTE — Progress Notes (Signed)
Cristina Weber is a 33 y.o. G3P0020 at [redacted]w[redacted]d by ultrasound admitted for active labor  Subjective:  Patient resting quietly in bed with eyes closed, spouse at bedside for continuous labor support.   Reports mild pelvic pressure and abdominal tightness with contractions.   Denies difficulty breathing or respiratory distress, chest pain, abdominal pain, excessive vaginal bleeding, dysuria, and leg pain or swelling.   Objective:  Temp:  [97.8 F (36.6 C)-98.8 F (37.1 C)] 97.8 F (36.6 C) (07/25 1356) Pulse Rate:  [56-93] 69 (07/25 1456) Resp:  [16-18] 16 (07/25 1035) BP: (87-133)/(44-77) 113/49 (07/25 1456) SpO2:  [95 %-100 %] 97 % (07/25 1536)  Fetal Wellbeing: FHR Category I  UC: Every three (3) to five (5) minutes, soft resting tone;   SVE:   Dilation: 8 Effacement (%): 90 Station: -1 Exam by:: Diego Cory CNM  Labs: Lab Results  Component Value Date   WBC 14.8 (H) 01/14/2019   HGB 13.8 01/14/2019   HCT 40.3 01/14/2019   MCV 89.4 01/14/2019   PLT 226 01/14/2019    Assessment:  Cristina Weber is a 33 y.o. G3P0020 at [redacted]w[redacted]d being admitted for labor, Rh positive, GBS negative, pitocin augmentation, COVID exposure in pregnancy-negative test results  FHR Category II  Plan:  Encouraged position change and use of peanut ball.   Continue orders as written. Reassess as needed.    Diona Fanti, CNM [facility], [facility] 01/14/2019, 4:19 PM

## 2019-01-14 NOTE — OB Triage Note (Signed)
Pt arrived to unit  With c/o ctx starting around 0000 and vaginal spotting. +FM. Denies LOF. Continue to assess.

## 2019-01-15 ENCOUNTER — Ambulatory Visit: Payer: Self-pay

## 2019-01-15 ENCOUNTER — Encounter: Payer: Self-pay | Admitting: Obstetrics and Gynecology

## 2019-01-15 DIAGNOSIS — Z862 Personal history of diseases of the blood and blood-forming organs and certain disorders involving the immune mechanism: Secondary | ICD-10-CM

## 2019-01-15 DIAGNOSIS — Z8759 Personal history of other complications of pregnancy, childbirth and the puerperium: Secondary | ICD-10-CM

## 2019-01-15 LAB — CBC
HCT: 32.3 % — ABNORMAL LOW (ref 36.0–46.0)
Hemoglobin: 10.9 g/dL — ABNORMAL LOW (ref 12.0–15.0)
MCH: 30.8 pg (ref 26.0–34.0)
MCHC: 33.7 g/dL (ref 30.0–36.0)
MCV: 91.2 fL (ref 80.0–100.0)
Platelets: 174 10*3/uL (ref 150–400)
RBC: 3.54 MIL/uL — ABNORMAL LOW (ref 3.87–5.11)
RDW: 14.6 % (ref 11.5–15.5)
WBC: 15.8 10*3/uL — ABNORMAL HIGH (ref 4.0–10.5)
nRBC: 0 % (ref 0.0–0.2)

## 2019-01-15 MED ORDER — BENZOCAINE-MENTHOL 20-0.5 % EX AERO: 1.0000 "application " | INHALATION_SPRAY | CUTANEOUS | 1 refills | Status: DC | PRN

## 2019-01-15 MED ORDER — FERROUS SULFATE 325 (65 FE) MG PO TABS: 325.0000 mg | ORAL_TABLET | Freq: Every day | ORAL | Status: DC

## 2019-01-15 MED ORDER — FERROUS SULFATE 325 (65 FE) MG PO TABS: 325.0000 mg | ORAL_TABLET | Freq: Every day | ORAL | 3 refills | Status: AC

## 2019-01-15 MED ORDER — IBUPROFEN 600 MG PO TABS: 600.0000 mg | ORAL_TABLET | Freq: Four times a day (QID) | ORAL | 0 refills | Status: DC

## 2019-01-15 MED ORDER — OXYTOCIN 40 UNITS IN NORMAL SALINE INFUSION - SIMPLE MED
INTRAVENOUS | Status: AC
  Filled 2019-01-15: qty 1000

## 2019-01-15 MED ORDER — ACETAMINOPHEN 325 MG PO TABS: 650.0000 mg | ORAL_TABLET | ORAL | 0 refills | Status: DC | PRN

## 2019-01-15 NOTE — Progress Notes (Signed)
Pt given discharge packet and went over in detail with pt and husband. Pt verbalizes understanding and all questions were answered. Pt was told to call pediatrician in the AM in set up a follow appt up for in 2 days. Pt understands. IV removed prior to discharge. VSS. Fundal assessment WDL. Pt stable to be discharged

## 2019-01-15 NOTE — Discharge Instructions (Signed)
La lactancia y el cuidado personal Breastfeeding and Self-Care El amamantamiento puede ser un desafo, especialmente, en las primeras semanas despus de dar a luz. Al comenzar a amamantar al nuevo beb, es normal que surjan algunos problemas, incluso si ya amamant antes. Hay ciertas cosas que puede hacer para cuidarse y ayudar a prevenir problemas frecuentes de Patent examiner. Consulte a su mdico o a Games developer (asesor en lactancia) para averiguar cules son las mejores estrategias para su caso. De qu modo la afecta a usted? Nadara Mode experiencia de lactancia depende en gran medida de mantener sus mamas sanas y asegurarse de que el beb se agarre bien al pezn (se prenda bien). Si el beb no se prende bien, pueden surgir problemas tales como los siguientes:  Pezones irritados o Chief Strategy Officer.  Mamas saturadas de leche (congestin [place]).  Obstruccin de los International Paper.  [place] produccin de [place].  Inflamacin o infeccin de las mamas. De qu modo lo afecta al beb? Al tomar ciertas medidas para evitar los problemas de la [place], podr asegurarse de que el beb se alimente de forma [place] y suba de peso como corresponde. Siga estas indicaciones en su casa: Estrategia para la lactancia   Asegrese siempre de que el beb se prenda a la mama y est en una posicin [place]. Pruebe diferentes posiciones para amamantar para encontrar una que funcione tanto para usted como para el beb.  Alimente al beb cuando muestre signos de hambre o si siente la necesidad de reducir la congestin de las [place]. Esto se denomina "lactancia a demanda".  No retrase los horarios para Museum/gallery exhibitions officer.  Intente relajarse cuando sea la hora de alimentar al beb. Esto ayuda a Licensed conveyancer reflejo de [place], que hace que la [place] comience a salir de la mama.  Para ayudar a que aumente el flujo de [place]: ? Extraiga una pequea cantidad [place] con las manos o con un sacaleches justo antes de  Museum/gallery exhibitions officer para ablandar la mama, la areola y Building surveyor. ? Aplquese calor hmedo en la mama justo antes de amamantar para aumentar la circulacin y Air traffic controller el flujo de la [place]. Puede hacerlo en la ducha o con toallas de mano humedecidas con agua tibia. ? Hgase masajes en las mamas justo antes de Museum/gallery exhibitions officer o mientras amamanta para aumentar la circulacin y Air traffic controller el flujo de la [place]. Cuidado de las [employer]PM[employer] & Co de Pharmacologist sus mamas humectadas y sanas. De este modo, evitar que se agrieten y International aid/development worker. Haga lo siguiente: ? Evite usar Eaton Corporation. ? Seque al Hovnanian Enterprises pezones durante 3 o despus de amamantar al beb. ? Utilice solo discos de Haematologist sostn para Insurance account manager Mirant se filtre. Asegrese de Costco Wholesale si se empapan con [place]. Si [place] discos desechables en el sostn, cmbielos con frecuencia. ? Colquese lanolina United Stationers pezones despus de Museum/gallery exhibitions officer. Si [place] lanolina pura, no tiene que lavarse los pezones antes de volver a Corporate treasurer al beb. La lanolina pura no es nociva (txica) para el beb. ? Hgase masajes en los pezones con Jeff materna: ? Saque con la mano algunas gotas de Hector (extraccin manual). ? Masajee suavemente la ARAMARK Corporation. ? Deje secar los pezones al aire.  Use un sostn para amamantar. Evite usar ropa [place], sostenes que presionen las mamas o sostenes con aro.  Aplquese una terapia con fro para Engineer, materials o la inflamacin de las mamas: ? Ponga el  hielo en una bolsa plstica. ? Coloque una Genuine Parts piel y la bolsa de hielo. ? Coloque el hielo durante 43minutos, 2 a 3veces por da. Instrucciones generales  Beba suficiente lquido como para mantener la orina de color amarillo plido.  Descanse lo suficiente. Duerma mientras el beb duerme.  Consulte con su mdico o un asesor en lactancia antes de tomar suplementos a base de hierbas. Comunquese con un mdico  si:  Tiene dolor en los pezones.  Presenta agrietamiento o irritacin en los pezones durante ms de 1semana.  Tiene congestin mamaria durante ms de 48 horas.  Tiene fiebre.  Le Clinical biochemist secrecin similar al pus por el pezn.  Presenta enrojecimiento, una erupcin, hinchazn, picazn o ardor en las mamas.  El nio pierde [place] o no [place] de [place]. Resumen  Una buena experiencia de lactancia depende en gran medida de mantener sus mamas sanas y asegurarse de que el beb se prenda bien a las [place]. Tome las medidas necesarias para cuidarse y consulte a su mdico o a Public relations account executive (asesor en Transport planner) para Neurosurgeon cules son las mejores estrategias para su caso.  Siempre asegrese de que el beb se prenda a la mama y est en una posicin [place]. Pruebe diferentes posiciones para amamantar para encontrar una que funcione tanto para usted como para el beb.  Mantenga sus pezones humectados, beba mucho lquido y descanse lo suficiente. Amamante al beb a demanda y no retrase los horarios para Economist. Esta informacin no tiene Marine scientist el consejo del mdico. Asegrese de hacerle al mdico cualquier pregunta que tenga. Document Released: 03/04/2017 Document Revised: 03/04/2017 Document Reviewed: 03/04/2017 Elsevier Patient Education  2020 [place] vaginal, cuidados de puerperio Postpartum Care After Vaginal Delivery Lea esta informacin sobre cmo cuidarse desde el momento en que nazca su beb y Waylan Boga 6 a 12 semanas despus del parto (perodo del posparto). El mdico tambin podr darle instrucciones ms especficas. Comunquese con su mdico si tiene problemas o preguntas. Siga estas indicaciones en su casa: Hemorragia vaginal  Es normal tener un poco de hemorragia vaginal (loquios) despus del parto. Use un apsito sanitario para el sangrado vaginal y secrecin. ? Durante la primera semana despus del parto, la cantidad y el aspecto de los  loquios a menudo es similar a las del perodo menstrual. ? Durante las siguientes semanas disminuir gradualmente hasta convertirse en una secrecin seca amarronada o [place]. ? En la AGCO Corporation, los loquios se detienen Franklin Resources 4 a 6semanas despus del [place]. Los sangrados vaginales pueden variar de mujer a Art therapist.  Cambie los apsitos sanitarios con frecuencia. Observe si hay cambios en el flujo, como: ? Un aumento repentino en el volumen. ? Cambio en el color. ? Cogulos sanguneos grandes.  Si expulsa un cogulo de sangre por la vagina, gurdelo y llame al mdico para informrselo. No deseche los cogulos de sangre por el inodoro antes de hablar con su mdico.  No use tampones ni se haga duchas vaginales hasta que el mdico la autorice.  Si no est amamantando, volver a tener su perodo entre 6 y 70 semanas despus del parto. Si solamente alimenta al beb con Steva Colder materna (lactancia materna exclusiva), podra no volver a tener su perodo hasta que deje de [place]. Cuidados perineales  Mantenga la zona entre la vagina y el ano (perineo) limpia y [place], [place] se lo haya indicado el mdico. Utilice apsitos o aerosoles analgsicos y Proofreader, como se lo hayan indicado.  Si le hicieron un corte en el perineo (episiotoma) o tuvo un desgarro en la vagina, controle la zona para detectar signos de infeccin hasta que sane. Est atenta a los siguientes signos: ? Aumento del enrojecimiento, la hinchazn o Chief Technology Officerel dolor. ? Presenta lquido o sangre que supura del corte o Insurance account managerdesgarro. ? Calor. ? Pus o mal olor.  Es posible que le den una botella rociadora para que use en lugar de limpiarse el rea con papel higinico despus de usar el bao. Cuando comience a Barrister's clerksanar, podr usar la botella rociadora antes de secarse. Asegrese de secarse suavemente.  Para aliviar el dolor causado por una episiotoma, un desgarro en la vagina o venas hinchadas en el ano (hemorroides), trate de tomar  un bao de asiento tibio 2 o 3 veces por da. Un bao de asiento es un bao de agua tibia que se toma mientras se est sentado. El agua solo debe Adult nursellegar hasta las caderas y cubrir las nalgas. Cuidado de las [address]  En los [address]imer[address] das despus del parto, las mamas pueden sentirse pesadas, llenas e incmodas (congestin [place] Hillsmamaria). Tambin puede escaparse leche de sus senos. El mdico puede sugerirle mtodos para Emergency planning/management officeraliviar este malestar. La congestin mamaria debera desaparecer al cabo de The Mutual of Omahaunos das.  Si est amamantando: ? Use un sostn que sujete y ajuste bien sus pechos. ? Mantenga los pezones secos y limpios. Aplquese cremas y ungentos, como se lo haya indicado el mdico. ? Es posible que deba usar discos de algodn en el sostn para Environmental health practitionerabsorber la Beaver Springsleche que se filtre de sus senos. ? Puede tener contracciones uterinas cada vez que amamante durante varias semanas despus del Lastrupparto. Las contracciones uterinas ayudan al tero a Hotel managerregresar a su tamao habitual. ? Si tiene algn problema con la lactancia materna, colabore con el mdico o un Holiday representativeasesor en lactancia.  Si no est amamantando: ? Evite tocarse Xcel Energymucho las mamas. Al hacerlo, podran producir ms leche. ? Use un sostn que le proporcione el ajuste correcto y compresas fras para reducir la hinchazn. ? No extraiga (saque) Colgate Palmoliveleche materna. Esto har que produzca ms WPS Resourcesleche. Intimidad y sexualidad  Pregntele al mdico cundo puede retomar la actividad sexual. Esto puede depender de lo siguiente: ? Su riesgo de sufrir infecciones. ? La rapidez con la que est sanando. ? Su comodidad y deseo de retomar la actividad sexual.  Despus del parto, puede quedar embarazada incluso si no ha tenido todava su perodo. Si lo desea, hable con el mdico acerca de los mtodos de control de la natalidad (mtodos anticonceptivos). Medicamentos  Baxter Internationalome los medicamentos de venta libre y los recetados solamente como se lo haya indicado el mdico.  Si le recetaron un  antibitico, tmelo como se lo haya indicado el mdico. No deje de tomar el antibitico aunque comience a sentirse mejor. Actividad  Retome sus actividades normales de a poco como se lo haya indicado el mdico. Pregntele al mdico qu actividades son seguras para usted.  Descanse todo lo que pueda. Trate de descansar o tomar una siesta mientras el beb duerme. Comida y bebida   Beba suficiente lquido como para Pharmacologistmantener la orina de color amarillo plido.  Coma alimentos ricos en Enbridge Energyfibras todos los das. Estos pueden ayudarla a prevenir o Educational psychologistaliviar el estreimiento. Los alimentos ricos en fibras incluyen, entre otros: ? Panes y cereales integrales. ? Arroz integral. ? Armed forces operational officerrijoles. ? Nils PyleFrutas y verduras frescas.  No intente perder de peso rpidamente reduciendo el consumo de caloras.  Tome sus vitaminas prenatales Whole Foodshasta  la visita de seguimiento de posparto o hasta que su mdico le indique que puede dejar de tomarlas. Estilo de vida  No consuma ningn producto que contenga nicotina o tabaco, como cigarrillos y Administrator, Civil Servicecigarrillos electrnicos. Si necesita ayuda para dejar de fumar, consulte al mdico.  No beba alcohol, especialmente si est amamantando. Instrucciones generales  Concurra a todas las visitas de seguimiento para usted y el beb, como se lo haya indicado el mdico. La mayora de las mujeres visita al mdico para un seguimiento de posparto dentro de las primeras 3 a 6 semanas despus del parto. Comunquese con un mdico si:  Se siente incapaz de controlar los cambios que implica tener un hijo y esos sentimientos no desaparecen.  Siente tristeza o preocupacin de forma inusual.  Las mamas se ponen rojas, le duelen o se endurecen.  Tiene fiebre.  Tiene dificultad para retener la Comorosorina o para impedir que la orina se escape.  Tiene poco inters o falta de inters en actividades que solan gustarle.  No ha amamantado nada y no ha tenido un perodo menstrual durante 12 semanas despus  del Edmondparto.  Dej de amamantar al beb y no ha tenido su perodo menstrual durante 12 semanas despus de dejar de Museum/gallery exhibitions officeramamantar.  Tiene preguntas sobre su cuidado y el del beb.  Elimina un cogulo de sangre grande por la vagina. Solicite ayuda de inmediato si:  Midwifeiente dolor en el pecho.  Tiene dificultad para respirar.  Tiene un dolor repentino e intenso en la pierna.  Tiene dolor intenso o clicos en el la parte inferior del abdomen.  Tiene una hemorragia tan intensa de la vagina que empapa ms de un apsito en Marshall & Ilsleyuna hora. El sangrado no debe ser ms abundante que el perodo ms intenso que haya tenido.  Dolor de cabeza intenso.  Se desmaya.  Tiene visin borrosa o Nurse, adultmanchas en la vista.  Tiene secrecin vaginal con mal olor.  Tiene pensamientos acerca de lastimarse a usted misma o a su beb. Si alguna vez siente que puede lastimarse a usted misma o a Economistotras personas, o tiene pensamientos de poner fin a su vida, busque ayuda de inmediato. Puede dirigirse al departamento de emergencias ms cercano o llamar a:  El servicio de emergencias de su localidad (911 en [place]  Una lnea de asistencia al suicida y Visual merchandiseratencin en crisis, como la Murphy OilLnea Nacional de Prevencin del Suicidio (National Suicide Prevention Lifeline), al [phone]. Est disponible las 24 horas del da. Resumen  El perodo de tiempo justo despus el parto y Elaina Hoopshasta 6 a 12 semanas despus del parto se denomina perodo posparto.  Retome sus actividades normales de a Naval architectpoco como se lo haya indicado el mdico.  Concurra a todas las visitas de seguimiento para usted y Dance movement psychotherapistel beb, como se lo haya indicado el mdico. Esta informacin no tiene Theme park managercomo fin reemplazar el consejo del mdico. Asegrese de hacerle al mdico cualquier pregunta que tenga. Document Released: 04/05/2007 Document Revised: 09/18/2017 Document Reviewed: 05/30/2017 Elsevier Patient Education  2020 ArvinMeritorElsevier Inc. Depresin posparto Postpartum Baby Blues El  perodo del posparto comienza inmediatamente despus del nacimiento de un beb. Este tiempo suele ser una poca de gran felicidad y mucho entusiasmo. Tambin es tiempo de muchos cambios en la vida de Delhilos padres. Independientemente de cuntas veces una madre d a luz, cada nio trae nuevos desafos a la familia, incluidas nuevas formas de relacionarse con los dems. Es frecuente tener sentimientos de entusiasmo y, a Licensed conveyancerla vez, cambios desconcertantes en el Rothsayestado de  nimo, las emociones y los pensamientos. Es posible que se sienta feliz un minuto y Austriatriste o estresada el siguiente. Estos sentimientos de tristeza suelen ocurrir inmediatamente despus del nacimiento y Electronics engineerdesaparecen en un lapso de una o Marsh & McLennandos semanas. Esto se llama depresin posparto. Cules son las causas? La depresin posparto no tiene una causa conocida. Probablemente sea consecuencia de una combinacin de factores. Sin embargo, se cree que los Affiliated Computer Servicescambios en los niveles hormonales ocurridos despus del nacimiento desencadenan algunos de los sntomas. Otros factores que pueden afectar estos cambios anmicos incluyen los siguientes:  Falta de sueo.  Sucesos estresantes de la vida, como la pobreza, el cuidado de un ser querido o la muerte de un ser querido.  Factores genticos. Cules son los signos o los sntomas? Los sntomas de esta afeccin incluyen los siguientes:  Cambios en el estado de nimo durante lapsos breves, como pasar de la felicidad extrema a la tristeza.  Falta de concentracin.  Dificultad para dormir.  Ataques de llanto y sensibilidad emocional.  Prdida del apetito.  Irritabilidad.  Ansiedad. Si los sntomas de la depresin posparto duran ms de 2semanas o Vanceboroempeoran, podra tener una forma ms grave de depresin posparto. Cmo se diagnostica? Esta afeccin se diagnostica en funcin de una evaluacin de los sntomas. No hay exmenes mdicos ni pruebas de laboratorio que Financial plannerpermitan hacer un diagnstico, pero hay  varios cuestionarios que el mdico podra usar para determinar si una mujer tiene depresin posparto o una forma ms grave de esta. Cmo se trata? No se requiere tratamiento para esta afeccin. Generalmente, la depresin posparto desaparece sin tratamiento en el trmino de 1 o 2semanas. A menudo, todo lo que se necesita es apoyo social. Se le recomendar que duerma y descanse lo suficiente. Siga estas indicaciones en su casa: Estilo de vida      Descanse todo lo posible. Tome una siesta cuando el beb duerma.  Haga actividad fsica habitualmente como se lo haya indicado el mdico. Para algunas mujeres, el yoga y las caminatas son tiles.  Consuma una dieta equilibrada y nutritiva. Esto incluye muchas frutas y verduras, cereales integrales y protenas magras.  Haga pequeas cosas que disfruta. Tome una taza de t, dese un bao de burbujas, lea su revista favorita o escuche su msica predilecta.  Evite el alcohol.  Pida ayuda con las tareas domsticas, la cocina, las compras, o las obligaciones diarias. No intente hacer todo usted misma. Considere la posibilidad de contratar a Neomia Dearuna doula para que la ayude y Psychiatric nurseasesore. Se trata de una profesional que se especializa en asistir a nuevas madres.  Intente no hacer ningn cambio importante durante el embarazo ni inmediatamente despus del nacimiento. Esto podra sumar estrs. Instrucciones generales  Hable con personas allegadas sobre cmo se siente. Busque el apoyo de su pareja, sus familiares, sus amigos o de Rockwell Automationotras madres primerizas. Quiz Remus Blakequiera unirse a un grupo de apoyo.  Encuentre formas de lidiar con Development worker, communityel estrs. Esto puede incluir lo siguiente: ? Escribir sus pensamientos y sentimientos en un diario. ? Pasar tiempo al OGE Energyaire libre. ? Pasar tiempo con personas que la hagan rer.  Intente pensar positivamente. Piense en aquellas cosas por las que se siente agradecida.  Tome los medicamentos de venta libre y los recetados solamente como se  lo haya indicado el mdico.  Infrmele a su mdico sobre cualquier inquietud que tenga.  Concurrir a todas las visitas durante el posparto tal como se lo haya indicado el mdico. Esto es importante. Comunquese con un mdico si:  La depresin posparto no desaparece despus de 2semanas. Solicite ayuda de inmediato si:  Tiene pensamientos sobre SCANA Corporation vida (pensamientos suicidas).  Cree que podra lastimar al beb o a Economist.  Ve u oye cosas que no existen (alucinaciones). Resumen  Despus de dar a luz, es posible que se sienta feliz un minuto y [place] o estresada el siguiente. Los sentimientos de tristeza que ocurren inmediatamente despus de que el beb nace y desaparecen luego de una o dos semanas se llaman depresin posparto.  Puede controlar la depresin posparto descansando lo suficiente, siguiendo una dieta saludable, realizando actividad fsica, pasando tiempo con personas que le den apoyo y encontrando modos de lidiar con Development worker, community.  Si los sentimientos de tristeza o estrs duran ms de 2semanas o perjudican el cuidado que le brinda al beb, hable con el mdico. Esto podra significar que tiene un caso ms grave de depresin posparto. Esta informacin no tiene Theme park manager el consejo del mdico. Asegrese de hacerle al mdico cualquier pregunta que tenga. Document Released: 11/25/2007 Document Revised: 09/06/2017 Document Reviewed: 02/19/2017 Elsevier Patient Education  2020 ArvinMeritor.

## 2019-01-15 NOTE — Lactation Note (Signed)
This note was copied from a baby's chart. Lactation Consultation Note  Patient Name: Cristina Weber YLTEI'H Date: 01/15/2019 Reason for consult: Follow-up assessment;[place]  Observed 2 breast feedings before mom discharged home.  Parents observed giving first bath.  Antionella latched with minimal assistance with wide open mouth and flanged lips and began strong rhythmic sucking with occasional audible swallows at this breast feeding.  Demonstrated hand expression of colostrum.  Discussed looking for feeding cues and encouraged mom to put her to the breast whenever she demonstrated hunger cues which mom has been doing religiously.  Praised mom for her commitment to consistently breast feed Antionella.  Reviewed newborn stomach size, supply and demand, normal course of lactation and routine newborn feeding patterns.  Discussed what to look for when mature milk transitioned.  Medela DEBP kit given and reviewed electric and manual use.  First time parents had a few questions which were addressed.  Lactation community resources reviewed and contact numbers given and encouraged to call with any questions, concerns or assistance.    Maternal Data Formula Feeding for Exclusion: No Has patient been taught Hand Expression?: Yes(Can easily hand express colostrum) Does the patient have breastfeeding experience prior to this delivery?: No  Feeding Feeding Type: Breast Fed  LATCH Score Latch: Grasps breast easily, tongue down, lips flanged, rhythmical sucking.  Audible Swallowing: A few with stimulation  Type of Nipple: Everted at rest and after stimulation  Comfort (Breast/Nipple): Soft / non-tender  Hold (Positioning): No assistance needed to correctly position infant at breast.  LATCH Score: 9  Interventions Interventions: Assisted with latch;Breast massage;Hand express;Breast compression;Adjust position;Support pillows  Lactation Tools Discussed/Used WIC Program: Yes Pump Review:  Setup, frequency, and cleaning;Milk Storage;Other (comment) Initiated by:: S.Jeovani Weisenburger,RN,BSN,IBCLC Date initiated:: 01/15/19   Consult Status Consult Status: PRN Date: 01/15/19 Follow-up type: Call as needed    Jarold Motto 01/15/2019, 9:24 PM

## 2019-01-15 NOTE — Discharge Summary (Signed)
Obstetric Discharge Summary  Patient ID: Cristina Weber MRN: [id number] DOB/AGE: 33/08/1985 33 y.o.   Date of Admission: 01/14/2019  Date of Discharge:  01/15/19  Admitting Diagnosis: Onset of Labor at 5118w4d  Secondary Diagnosis: Oligohydramnios, Close exposure to COVID-19 virus  Mode of Delivery: Vacuum-assisted vaginal delivery     Discharge Diagnosis: No other diagnosis   Intrapartum Procedures: Atificial rupture of membranes, epidural, pitocin augmentation, placement of fetal scalp electrode and placement of intrauterine catheter   Post partum procedures: None  Complications: Bilateral labial and  vaginal lacerations, repaired   Brief Hospital Course   Cristina Weber is a [id number] who had a vacuum assisted vaginal birth on 01/14/2019;  for further details of this birth, please refer to the delivey summary.  Patient had an uncomplicated postpartum course.  By time of discharge on PPD#1, her pain was controlled on oral pain medications; she had appropriate lochia and was ambulating, voiding without difficulty and tolerating regular diet.  She was deemed stable for discharge to home.    Labs: CBC Latest Ref Rng & Units 01/15/2019 01/14/2019 06/28/2018  WBC 4.0 - 10.5 K/uL 15.8(H) 14.8(H) 8.7  Hemoglobin 12.0 - 15.0 g/dL 10.9(L) 13.8 13.8  Hematocrit 36.0 - 46.0 % 32.3(L) 40.3 41.2  Platelets 150 - 400 K/uL 174 226 267   O POS  Physical exam:   Temp:  [97.8 F (36.6 C)-98.9 F (37.2 C)] 97.8 F (36.6 C) (07/26 0912) Pulse Rate:  [61-93] 61 (07/26 0912) Resp:  [14-16] 16 (07/26 0912) BP: (96-128)/(49-74) 115/60 (07/26 0912) SpO2:  [96 %-100 %] 97 % (07/26 0535) Weight:  [69.4 kg] 69.4 kg (07/26 0912)  General: alert and no distress  Lochia: appropriate  Abdomen: soft, NT  Uterine Fundus: firm  Lacerations: healing well, no significant drainage, no dehiscence, no significant erythema  Extremities: No evidence of DVT seen on physical exam. No lower extremity  edema.  Discharge Instructions: Per After Visit Summary.  Activity: Advance as tolerated. Pelvic rest for 6 weeks.  Also refer to After Visit Summary  Diet: Regular  Medications: Allergies as of 01/15/2019   No Known Allergies     Medication List    TAKE these medications   acetaminophen 325 MG tablet Commonly known as: Tylenol Take 2 tablets (650 mg total) by mouth every 4 (four) hours as needed (for pain scale < 4).   benzocaine-Menthol 20-0.5 % Aero Commonly known as: DERMOPLAST Apply 1 application topically as needed for irritation (perineal discomfort).   ferrous sulfate 325 (65 FE) MG tablet Take 1 tablet (325 mg total) by mouth daily with breakfast.   ibuprofen 600 MG tablet Commonly known as: ADVIL Take 1 tablet (600 mg total) by mouth every 6 (six) hours.   prenatal multivitamin Tabs tablet Take 1 tablet by mouth daily at 12 noon.      Outpatient follow up:  Follow-up Information    Gunnar BullaLawhorn, Jenkins Michelle, CNM. Schedule an appointment as soon as possible for a visit in 6 week(s).   Specialties: Certified Nurse Midwife, Obstetrics and Gynecology, Radiology Why: Please [facility] (6) [facility] Contact information: [address] [place] [id number] [phone]          Postpartum contraception: Nexplanon  Discharged Condition: stable  Discharged to: home   Newborn Data:  Disposition:home with mother  Apgars: APGAR (1 MIN): 8   APGAR (5 MINS): 9    Baby Feeding: Breast   Gunnar BullaJenkins Michelle Lawhorn, CNM [facility],  [place] 01/15/19 12:13 PM

## 2019-01-15 NOTE — Anesthesia Postprocedure Evaluation (Signed)
Anesthesia Post Note  Patient: Cristina Weber  Procedure(s) Performed: AN AD HOC LABOR EPIDURAL  Patient location during evaluation: Mother Baby Anesthesia Type: Epidural Level of consciousness: awake and alert Pain management: pain level controlled Vital Signs Assessment: post-procedure vital signs reviewed and stable Respiratory status: spontaneous breathing, nonlabored ventilation and respiratory function stable Cardiovascular status: stable Postop Assessment: no headache, no backache and epidural receding Anesthetic complications: no     Last Vitals:  Vitals:   01/15/19 0535 01/15/19 0912  BP: (!) 102/59 115/60  Pulse: 70 61  Resp: 14 16  Temp:  36.6 C  SpO2: 97%     Last Pain:  Vitals:   01/15/19 0923  TempSrc:   PainSc: [place]

## 2019-01-16 LAB — NOVEL CORONAVIRUS, NAA: SARS-CoV-2, NAA: NOT DETECTED

## 2019-01-16 LAB — RPR: RPR Ser Ql: NONREACTIVE

## 2019-01-17 LAB — SURGICAL PATHOLOGY

## 2019-02-24 ENCOUNTER — Other Ambulatory Visit: Payer: Self-pay

## 2019-02-24 ENCOUNTER — Ambulatory Visit (INDEPENDENT_AMBULATORY_CARE_PROVIDER_SITE_OTHER): Payer: Medicaid Other | Admitting: Certified Nurse Midwife

## 2019-02-24 ENCOUNTER — Encounter: Payer: Self-pay | Admitting: Certified Nurse Midwife

## 2019-02-24 NOTE — Patient Instructions (Signed)
Etonogestrel implant What is this medicine? ETONOGESTREL (et oh noe JES trel) is a contraceptive (birth control) device. It is used to prevent pregnancy. It can be used for up to 3 years. This medicine may be used for other purposes; ask your health care provider or pharmacist if you have questions. COMMON BRAND NAME(S): Implanon, Nexplanon What should I tell my health care provider before I take this medicine? They need to know if you have any of these conditions:  abnormal vaginal bleeding  blood vessel disease or blood clots  breast, cervical, endometrial, ovarian, liver, or uterine cancer  diabetes  gallbladder disease  heart disease or recent heart attack  high blood pressure  high cholesterol or triglycerides  kidney disease  liver disease  migraine headaches  seizures  stroke  tobacco smoker  an unusual or allergic reaction to etonogestrel, anesthetics or antiseptics, other medicines, foods, dyes, or preservatives  pregnant or trying to get pregnant  breast-feeding How should I use this medicine? This device is inserted just under the skin on the inner side of your upper arm by a health care professional. Talk to your pediatrician regarding the use of this medicine in children. Special care may be needed. Overdosage: If you think you have taken too much of this medicine contact a poison control center or emergency room at once. NOTE: This medicine is only for you. Do not share this medicine with others. What if I miss a dose? This does not apply. What may interact with this medicine? Do not take this medicine with any of the following medications:  amprenavir  fosamprenavir This medicine may also interact with the following medications:  acitretin  aprepitant  armodafinil  bexarotene  bosentan  carbamazepine  certain medicines for fungal infections like fluconazole, ketoconazole, itraconazole and voriconazole  certain medicines to treat  hepatitis, HIV or AIDS  cyclosporine  felbamate  griseofulvin  lamotrigine  modafinil  oxcarbazepine  phenobarbital  phenytoin  primidone  rifabutin  rifampin  rifapentine  St. John's wort  topiramate This list may not describe all possible interactions. Give your health care provider a list of all the medicines, herbs, non-prescription drugs, or dietary supplements you use. Also tell them if you smoke, drink alcohol, or use illegal drugs. Some items may interact with your medicine. What should I watch for while using this medicine? This product does not protect you against HIV infection (AIDS) or other sexually transmitted diseases. You should be able to feel the implant by pressing your fingertips over the skin where it was inserted. Contact your doctor if you cannot feel the implant, and use a non-hormonal birth control method (such as condoms) until your doctor confirms that the implant is in place. Contact your doctor if you think that the implant may have broken or become bent while in your arm. You will receive a user card from your health care provider after the implant is inserted. The card is a record of the location of the implant in your upper arm and when it should be removed. Keep this card with your health records. What side effects may I notice from receiving this medicine? Side effects that you should report to your doctor or health care professional as soon as possible:  allergic reactions like skin rash, itching or hives, swelling of the face, lips, or tongue  breast lumps, breast tissue changes, or discharge  breathing problems  changes in emotions or moods  if you feel that the implant may have broken or   bent while in your arm  high blood pressure  pain, irritation, swelling, or bruising at the insertion site  scar at site of insertion  signs of infection at the insertion site such as fever, and skin redness, pain or discharge  signs and  symptoms of a blood clot such as breathing problems; changes in vision; chest pain; severe, sudden headache; pain, swelling, warmth in the leg; trouble speaking; sudden numbness or weakness of the face, arm or leg  signs and symptoms of liver injury like dark yellow or brown urine; general ill feeling or flu-like symptoms; light-colored stools; loss of appetite; nausea; right upper belly pain; unusually weak or tired; yellowing of the eyes or skin  unusual vaginal bleeding, discharge Side effects that usually do not require medical attention (report to your doctor or health care professional if they continue or are bothersome):  acne  breast pain or tenderness  headache  irregular menstrual bleeding  nausea This list may not describe all possible side effects. Call your doctor for medical advice about side effects. You may report side effects to FDA at 1-800-FDA-1088. Where should I keep my medicine? This drug is given in a hospital or clinic and will not be stored at home. NOTE: This sheet is a summary. It may not cover all possible information. If you have questions about this medicine, talk to your doctor, pharmacist, or health care provider.  2020 Elsevier/Gold Standard (2017-04-27 14:11:42) Preventive Care 21-39 Years Old, Female Preventive care refers to visits with your health care provider and lifestyle choices that can promote health and wellness. This includes:  A yearly physical exam. This may also be called an annual well check.  Regular dental visits and eye exams.  Immunizations.  Screening for certain conditions.  Healthy lifestyle choices, such as eating a healthy diet, getting regular exercise, not using drugs or products that contain nicotine and tobacco, and limiting alcohol use. What can I expect for my preventive care visit? Physical exam Your health care provider will check your:  Height and weight. This may be used to calculate body mass index (BMI), which  tells if you are at a healthy weight.  Heart rate and blood pressure.  Skin for abnormal spots. Counseling Your health care provider may ask you questions about your:  Alcohol, tobacco, and drug use.  Emotional well-being.  Home and relationship well-being.  Sexual activity.  Eating habits.  Work and work environment.  Method of birth control.  Menstrual cycle.  Pregnancy history. What immunizations do I need?  Influenza (flu) vaccine  This is recommended every year. Tetanus, diphtheria, and pertussis (Tdap) vaccine  You may need a Td booster every 10 years. Varicella (chickenpox) vaccine  You may need this if you have not been vaccinated. Human papillomavirus (HPV) vaccine  If recommended by your health care provider, you may need three doses over 6 months. Measles, mumps, and rubella (MMR) vaccine  You may need at least one dose of MMR. You may also need a second dose. Meningococcal conjugate (MenACWY) vaccine  One dose is recommended if you are age 19-21 years and a first-year college student living in a residence hall, or if you have one of several medical conditions. You may also need additional booster doses. Pneumococcal conjugate (PCV13) vaccine  You may need this if you have certain conditions and were not previously vaccinated. Pneumococcal polysaccharide (PPSV23) vaccine  You may need one or two doses if you smoke cigarettes or if you have certain conditions. Hepatitis   A vaccine  You may need this if you have certain conditions or if you travel or work in places where you may be exposed to hepatitis A. Hepatitis B vaccine  You may need this if you have certain conditions or if you travel or work in places where you may be exposed to hepatitis B. Haemophilus influenzae type b (Hib) vaccine  You may need this if you have certain conditions. You may receive vaccines as individual doses or as more than one vaccine together in one shot (combination  vaccines). Talk with your health care provider about the risks and benefits of combination vaccines. What tests do I need?  Blood tests  Lipid and cholesterol levels. These may be checked every 5 years starting at age 20.  Hepatitis C test.  Hepatitis B test. Screening  Diabetes screening. This is done by checking your blood sugar (glucose) after you have not eaten for a while (fasting).  Sexually transmitted disease (STD) testing.  BRCA-related cancer screening. This may be done if you have a family history of breast, ovarian, tubal, or peritoneal cancers.  Pelvic exam and Pap test. This may be done every 3 years starting at age 21. Starting at age 30, this may be done every 5 years if you have a Pap test in combination with an HPV test. Talk with your health care provider about your test results, treatment options, and if necessary, the need for more tests. Follow these instructions at home: Eating and drinking   Eat a diet that includes fresh fruits and vegetables, whole grains, lean protein, and low-fat dairy.  Take vitamin and mineral supplements as recommended by your health care provider.  Do not drink alcohol if: ? Your health care provider tells you not to drink. ? You are pregnant, may be pregnant, or are planning to become pregnant.  If you drink alcohol: ? Limit how much you have to 0-1 drink a day. ? Be aware of how much alcohol is in your drink. In the [place] one drink equals one 12 oz bottle of beer (355 mL), one 5 oz glass of wine (148 mL), or one 1 oz glass of hard liquor (44 mL). Lifestyle  Take daily care of your teeth and gums.  Stay active. Exercise for at least 30 minutes on 5 or more days each week.  Do not use any products that contain nicotine or tobacco, such as cigarettes, e-cigarettes, and chewing tobacco. If you need help quitting, ask your health care provider.  If you are sexually active, practice safe sex. Use a condom or other form of birth  control (contraception) in order to prevent pregnancy and STIs (sexually transmitted infections). If you plan to become pregnant, see your health care provider for a preconception visit. What's next?  Visit your health care provider once a year for a well check visit.  Ask your health care provider how often you should have your eyes and teeth checked.  Stay up to date on all vaccines. This information is not intended to replace advice given to you by your health care provider. Make sure you discuss any questions you have with your health care provider. Document Released: 08/04/2001 Document Revised: 02/17/2018 Document Reviewed: 02/17/2018 Elsevier Patient Education  2020 Elsevier Inc.  

## 2019-02-24 NOTE — Progress Notes (Signed)
Subjective:    Cristina Weber is a 33 y.o. [id number] Hispanic female who presents for a postpartum visit. She is 6 weeks postpartum following a vacuum, low assisted vaginal birth at 40+4 gestational weeks. Anesthesia: epidural. I have fully reviewed the prenatal and intrapartum course.   Postpartum course has been uncomplicated. Baby's course has been uncomplicated. Baby is feeding by formula. Bleeding no bleeding. Bowel function is normal. Bladder function is normal.   Patient is not sexually active.  Contraception method is abstinence and Nexplanon. Postpartum depression screening: negative. Score 0.  Last pap 06/2018 and was Negative/Negative.  Denies difficulty breathing or respiratory distress, chest pain, abdominal pain, excessive vaginal bleeding, dysuria, and leg pain or swelling.   The following portions of the patient's history were reviewed and updated as appropriate: allergies, current medications, past medical history, past surgical history and problem list.  Review of Systems  Pertinent items are noted in HPI.   Objective:   BP 133/81   Pulse 61   Ht 4\' 9"  (1.448 m)   Wt 132 lb 1.6 oz (59.9 kg)   LMP 04/06/2018 (Exact Date)   Breastfeeding No   BMI 28.59 kg/m  General:  alert, cooperative and no distress   Breasts:  deferred, no complaints  Lungs: clear to auscultation bilaterally  Heart:  regular rate and rhythm  Abdomen: soft, nontender   Vulva: normal  Vagina: normal vagina  Cervix:  closed  Corpus: Well-involuted  Adnexa:  Non-palpable   Depression screen [facility] 2/9 02/24/2019  Decreased Interest 0  Down, Depressed, Hopeless 0  PHQ - 2 Score 0  Altered sleeping 0  Tired, decreased energy 0  Change in appetite 0  Feeling bad or failure about yourself  0  Trouble concentrating 0  Moving slowly or fidgety/restless 0  Suicidal thoughts 0  PHQ-9 Score 0         Assessment:   Postpartum exam Six (6) wks s/p vacuum assisted vaginal birth Formula  feeding Depression screening Contraception counseling   Plan:   Encouraged routine health maintenance techniques.   Reviewed red flag symptoms and when to call.   Follow up in: 2 weeks for Nexplanon insertion or earlier if needed.   Diona Fanti, CNM [facility], [facility] 02/24/19 10:18 AM

## 2019-02-24 NOTE — Progress Notes (Signed)
Patient here for post-partum visit, no complaints.

## 2019-02-28 ENCOUNTER — Other Ambulatory Visit: Payer: Self-pay

## 2019-02-28 ENCOUNTER — Ambulatory Visit (INDEPENDENT_AMBULATORY_CARE_PROVIDER_SITE_OTHER): Payer: Medicaid Other | Admitting: Certified Nurse Midwife

## 2019-02-28 ENCOUNTER — Encounter: Payer: Self-pay | Admitting: Certified Nurse Midwife

## 2019-02-28 VITALS — BP 99/68 | HR 60 | Ht <= 58 in | Wt 135.7 lb

## 2019-02-28 DIAGNOSIS — Z3046 Encounter for surveillance of implantable subdermal contraceptive: Secondary | ICD-10-CM

## 2019-02-28 DIAGNOSIS — Z30017 Encounter for initial prescription of implantable subdermal contraceptive: Secondary | ICD-10-CM

## 2019-02-28 DIAGNOSIS — Z975 Presence of (intrauterine) contraceptive device: Secondary | ICD-10-CM | POA: Insufficient documentation

## 2019-02-28 NOTE — Patient Instructions (Signed)
Nexplanon Instructions After Insertion   Keep bandage clean and dry for 24 hours   May use ice/Tylenol/Ibuprofen for soreness or pain   If you develop fever, drainage or increased warmth from incision site-contact office immediately    Etonogestrel implant What is this medicine? ETONOGESTREL (et oh noe JES trel) is a contraceptive (birth control) device. It is used to prevent pregnancy. It can be used for up to 3 years. This medicine may be used for other purposes; ask your health care provider or pharmacist if you have questions. COMMON BRAND NAME(S): Implanon, Nexplanon What should I tell my health care provider before I take this medicine? They need to know if you have any of these conditions:  abnormal vaginal bleeding  blood vessel disease or blood clots  breast, cervical, endometrial, ovarian, liver, or uterine cancer  diabetes  gallbladder disease  heart disease or recent heart attack  high blood pressure  high cholesterol or triglycerides  kidney disease  liver disease  migraine headaches  seizures  stroke  tobacco smoker  an unusual or allergic reaction to etonogestrel, anesthetics or antiseptics, other medicines, foods, dyes, or preservatives  pregnant or trying to get pregnant  breast-feeding How should I use this medicine? This device is inserted just under the skin on the inner side of your upper arm by a health care professional. Talk to your pediatrician regarding the use of this medicine in children. Special care may be needed. Overdosage: If you think you have taken too much of this medicine contact a poison control center or emergency room at once. NOTE: This medicine is only for you. Do not share this medicine with others. What if I miss a dose? This does not apply. What may interact with this medicine? Do not take this medicine with any of the following medications:  amprenavir  fosamprenavir This medicine may also interact with  the following medications:  acitretin  aprepitant  armodafinil  bexarotene  bosentan  carbamazepine  certain medicines for fungal infections like fluconazole, ketoconazole, itraconazole and voriconazole  certain medicines to treat hepatitis, HIV or AIDS  cyclosporine  felbamate  griseofulvin  lamotrigine  modafinil  oxcarbazepine  phenobarbital  phenytoin  primidone  rifabutin  rifampin  rifapentine  St. John's wort  topiramate This list may not describe all possible interactions. Give your health care provider a list of all the medicines, herbs, non-prescription drugs, or dietary supplements you use. Also tell them if you smoke, drink alcohol, or use illegal drugs. Some items may interact with your medicine. What should I watch for while using this medicine? This product does not protect you against HIV infection (AIDS) or other sexually transmitted diseases. You should be able to feel the implant by pressing your fingertips over the skin where it was inserted. Contact your doctor if you cannot feel the implant, and use a non-hormonal birth control method (such as condoms) until your doctor confirms that the implant is in place. Contact your doctor if you think that the implant may have broken or become bent while in your arm. You will receive a user card from your health care provider after the implant is inserted. The card is a record of the location of the implant in your upper arm and when it should be removed. Keep this card with your health records. What side effects may I notice from receiving this medicine? Side effects that you should report to your doctor or health care professional as soon as possible:  allergic reactions   rash, itching or hives, swelling of the face, lips, or tongue  breast lumps, breast tissue changes, or discharge  breathing problems  changes in emotions or moods  if you feel that the implant may have broken or bent  while in your arm  high blood pressure  pain, irritation, swelling, or bruising at the insertion site  scar at site of insertion  signs of infection at the insertion site such as fever, and skin redness, pain or discharge  signs and symptoms of a blood clot such as breathing problems; changes in vision; chest pain; severe, sudden headache; pain, swelling, warmth in the leg; trouble speaking; sudden numbness or weakness of the face, arm or leg  signs and symptoms of liver injury like dark yellow or brown urine; general ill feeling or flu-like symptoms; light-colored stools; loss of appetite; nausea; right upper belly pain; unusually weak or tired; yellowing of the eyes or skin  unusual vaginal bleeding, discharge Side effects that usually do not require medical attention (report to your doctor or health care professional if they continue or are bothersome):  acne  breast pain or tenderness  headache  irregular menstrual bleeding  nausea This list may not describe all possible side effects. Call your doctor for medical advice about side effects. You may report side effects to FDA at 1-800-FDA-1088. Where should I keep my medicine? This drug is given in a hospital or clinic and will not be stored at home. NOTE: This sheet is a summary. It may not cover all possible information. If you have questions about this medicine, talk to your doctor, pharmacist, or health care provider.  2020 Elsevier/Gold Standard (2017-04-27 14:11:42)  Preventive Care 21-39 Years Old, Female Preventive care refers to visits with your health care provider and lifestyle choices that can promote health and wellness. This includes:  A yearly physical exam. This may also be called an annual well check.  Regular dental visits and eye exams.  Immunizations.  Screening for certain conditions.  Healthy lifestyle choices, such as eating a healthy diet, getting regular exercise, not using drugs or products that  contain nicotine and tobacco, and limiting alcohol use. What can I expect for my preventive care visit? Physical exam Your health care provider will check your:  Height and weight. This may be used to calculate body mass index (BMI), which tells if you are at a healthy weight.  Heart rate and blood pressure.  Skin for abnormal spots. Counseling Your health care provider may ask you questions about your:  Alcohol, tobacco, and drug use.  Emotional well-being.  Home and relationship well-being.  Sexual activity.  Eating habits.  Work and work environment.  Method of birth control.  Menstrual cycle.  Pregnancy history. What immunizations do I need?  Influenza (flu) vaccine  This is recommended every year. Tetanus, diphtheria, and pertussis (Tdap) vaccine  You may need a Td booster every 10 years. Varicella (chickenpox) vaccine  You may need this if you have not been vaccinated. Human papillomavirus (HPV) vaccine  If recommended by your health care provider, you may need three doses over 6 months. Measles, mumps, and rubella (MMR) vaccine  You may need at least one dose of MMR. You may also need a second dose. Meningococcal conjugate (MenACWY) vaccine  One dose is recommended if you are age 19-21 years and a first-year college student living in a residence hall, or if you have one of several medical conditions. You may also need additional booster doses. Pneumococcal conjugate (  PCV13) vaccine  You may need this if you have certain conditions and were not previously vaccinated. Pneumococcal polysaccharide (PPSV23) vaccine  You may need one or two doses if you smoke cigarettes or if you have certain conditions. Hepatitis A vaccine  You may need this if you have certain conditions or if you travel or work in places where you may be exposed to hepatitis A. Hepatitis B vaccine  You may need this if you have certain conditions or if you travel or work in places  where you may be exposed to hepatitis B. Haemophilus influenzae type b (Hib) vaccine  You may need this if you have certain conditions. You may receive vaccines as individual doses or as more than one vaccine together in one shot (combination vaccines). Talk with your health care provider about the risks and benefits of combination vaccines. What tests do I need?  Blood tests  Lipid and cholesterol levels. These may be checked every 5 years starting at age 20.  Hepatitis C test.  Hepatitis B test. Screening  Diabetes screening. This is done by checking your blood sugar (glucose) after you have not eaten for a while (fasting).  Sexually transmitted disease (STD) testing.  BRCA-related cancer screening. This may be done if you have a family history of breast, ovarian, tubal, or peritoneal cancers.  Pelvic exam and Pap test. This may be done every 3 years starting at age 21. Starting at age 30, this may be done every 5 years if you have a Pap test in combination with an HPV test. Talk with your health care provider about your test results, treatment options, and if necessary, the need for more tests. Follow these instructions at home: Eating and drinking   Eat a diet that includes fresh fruits and vegetables, whole grains, lean protein, and low-fat dairy.  Take vitamin and mineral supplements as recommended by your health care provider.  Do not drink alcohol if: ? Your health care provider tells you not to drink. ? You are pregnant, may be pregnant, or are planning to become pregnant.  If you drink alcohol: ? Limit how much you have to 0-1 drink a day. ? Be aware of how much alcohol is in your drink. In the [place] one drink equals one 12 oz bottle of beer (355 mL), one 5 oz glass of wine (148 mL), or one 1 oz glass of hard liquor (44 mL). Lifestyle  Take daily care of your teeth and gums.  Stay active. Exercise for at least 30 minutes on 5 or more days each week.  Do not use  any products that contain nicotine or tobacco, such as cigarettes, e-cigarettes, and chewing tobacco. If you need help quitting, ask your health care provider.  If you are sexually active, practice safe sex. Use a condom or other form of birth control (contraception) in order to prevent pregnancy and STIs (sexually transmitted infections). If you plan to become pregnant, see your health care provider for a preconception visit. What's next?  Visit your health care provider once a year for a well check visit.  Ask your health care provider how often you should have your eyes and teeth checked.  Stay up to date on all vaccines. This information is not intended to replace advice given to you by your health care provider. Make sure you discuss any questions you have with your health care provider. Document Released: 08/04/2001 Document Revised: 02/17/2018 Document Reviewed: 02/17/2018 Elsevier Patient Education  2020 Elsevier Inc.  

## 2019-02-28 NOTE — Progress Notes (Signed)
Patient here for Nexplanon insertion.  Patient started menstrual period today and c/o throbbing vaginal pain.

## 2019-02-28 NOTE — Progress Notes (Signed)
Cristina Weber is a 33 y.o. year old Hispanic female here for Nexplanon insertion.  Patient's last menstrual period was 02/28/2019.   Risks/benefits/side effects of Nexplanon have been discussed and her questions have been answered.  Specifically, a failure rate of 06/998 has been reported, with an increased failure rate if pt takes Farrell and/or antiseizure medicaitons.  [name] is aware of the common side effect of irregular bleeding, which the incidence of decreases over time.  BP 99/68   Pulse 60   Ht 4\' 9"  (1.448 m)   Wt 135 lb 11.2 oz (61.6 kg)   LMP 02/28/2019   Breastfeeding No   BMI 29.37 kg/m   She is right-handed, so her left arm, approximately 4 inches proximal from the elbow, was cleansed with alcohol and anesthetized with 2cc of 2% Lidocaine.  The area was cleansed again with betadine and the Nexplanon was inserted per manufacturer's recommendations without difficulty.  A steri-strip and pressure bandage were applied.  Pt was instructed to keep the area clean and dry, remove pressure bandage in 24 hours, and keep insertion site covered with the steri-strip for 3-5 days.  Back up contraception was recommended for 2 weeks.    She was given a card indicating date Nexplanon was inserted and date it needs to be removed.   Reviewed red flag symptoms and when to call.   RTC x 9 months for ANNUAL EXAM or sooner if needed.    Diona Fanti, CNM [facility], [facility] 02/28/19 3:55 PM   [place]: [id number] Exp: 04/2021 Lot: [id number]

## 2019-03-15 ENCOUNTER — Encounter: Payer: Self-pay | Admitting: Certified Nurse Midwife

## 2019-05-17 IMAGING — MR MR BRAIN/IAC WO/W
10 of 13 series · 27 of 48 positions shown · IV contrast (agent unspecified)
Comparison: None.

CLINICAL DATA: Abnormal buzzing sound on the right, worsened over
the last 4 months.

EXAM:
MRI HEAD WITHOUT AND WITH CONTRAST
TECHNIQUE: Multiplanar, multiecho pulse sequences of the brain and surrounding
structures were obtained without and with intravenous contrast.
CONTRAST:  5.5 cc Gadovist

[Series 5: T1 · sagittal · 5.0mm · 0.62mm/px · 3 of 25 slices shown (1 of 3)]
[im 1/25]
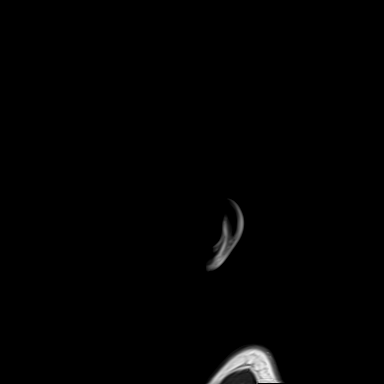
[im 13/25]
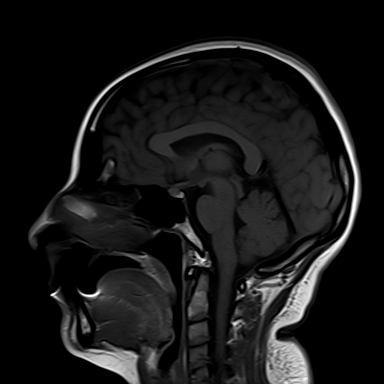
[im 25/25]
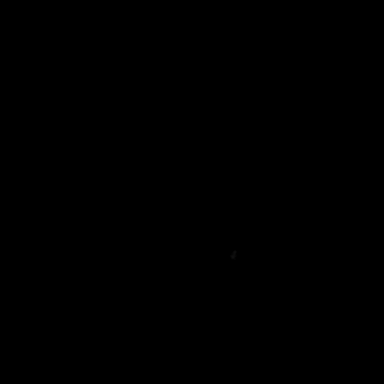

[Series 6: ax dwi_tracew · axial · 3.0mm · 0.73mm/px · z∈[-34,+126]mm · 4 of 55 slices shown]
[im 1/55]
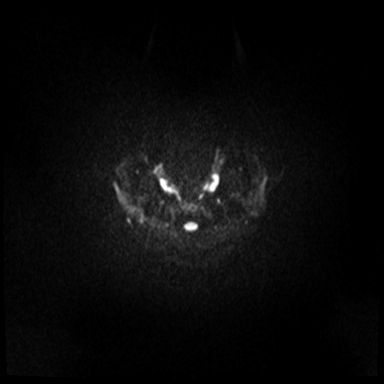
[im 19/55]
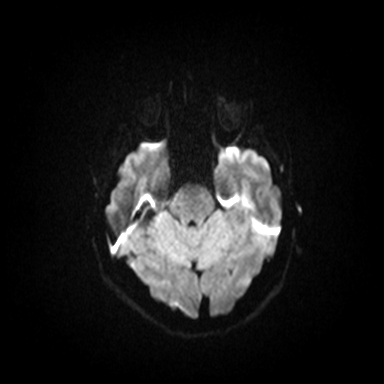
[im 37/55]
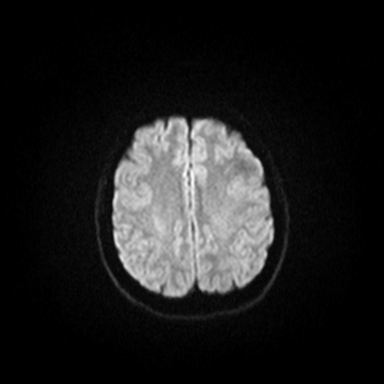
[im 55/55]
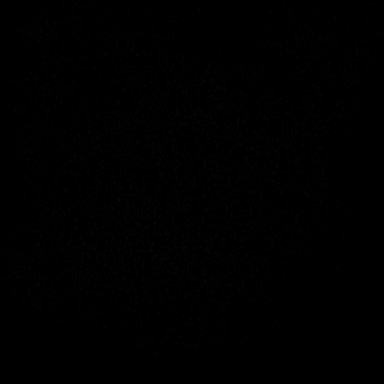

[Series 7: ax dwi_adc · axial · 3.0mm · 0.73mm/px · 1 of 54 slices shown]
[im 1/54]
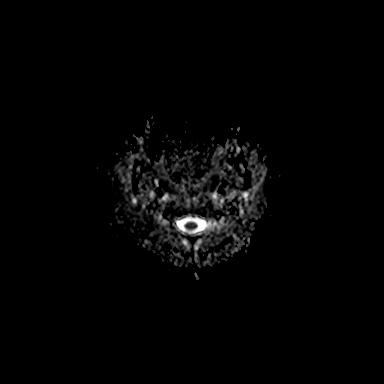

[Series 8: T2 · axial · 5.0mm · 0.53mm/px · z∈[-29,+124]mm · 2 of 27 slices shown]
[im 1/27]
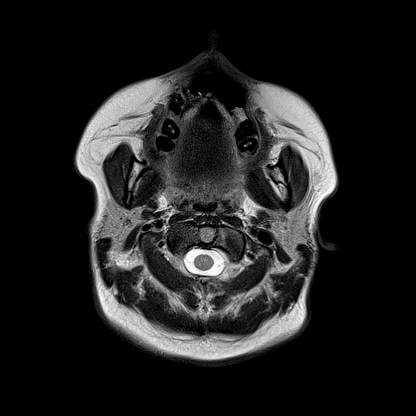
[im 27/27]
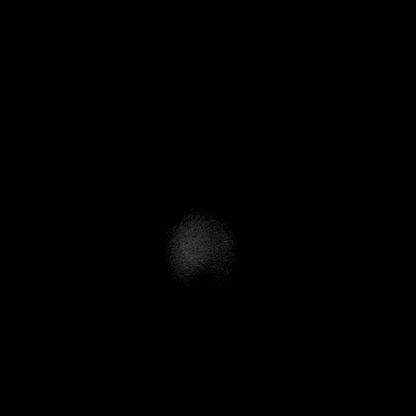

[Series 11: T1 · coronal · non-contrast · 3.0mm · 0.21mm/px · 1 of 13 slices shown (2 of 3)]
[im 1/13]
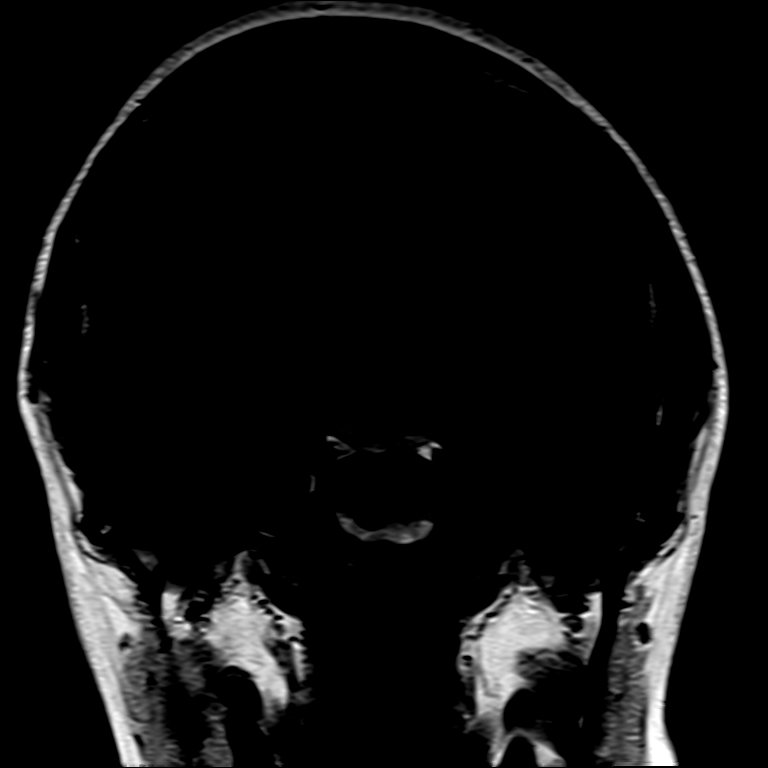

[Series 12: FLAIR · axial · 3.0mm · 0.53mm/px · z∈[-32,+127]mm · 4 of 55 slices shown]
[im 1/55]
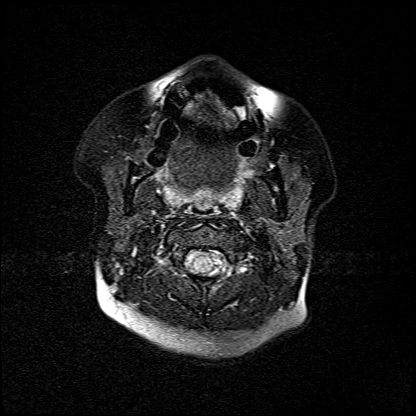
[im 19/55]
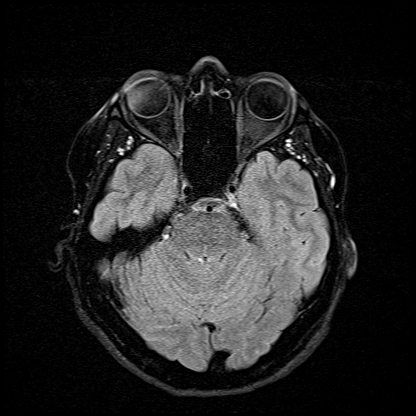
[im 37/55]
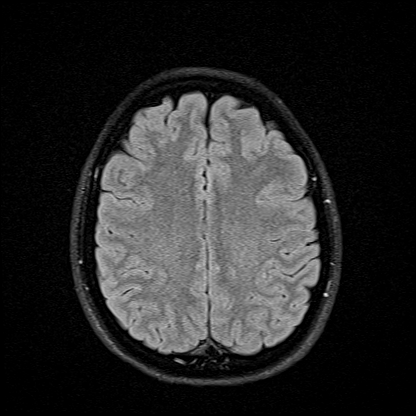
[im 55/55]
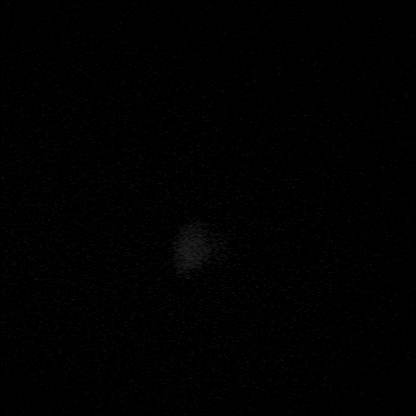

[Series 14: T1 · axial · non-contrast · 3.0mm · 0.21mm/px · 1 of 15 slices shown (3 of 3)]
[im 1/15]
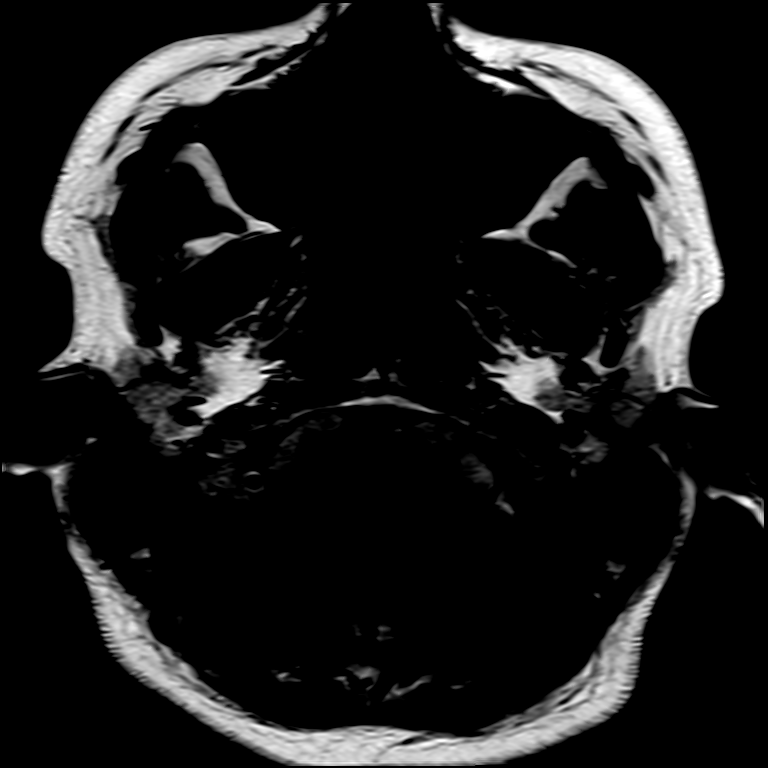

[Series 15: T1 post-contrast · axial · 3.0mm · 0.21mm/px · 1 of 15 slices shown (1 of 3)]
[im 1/15]
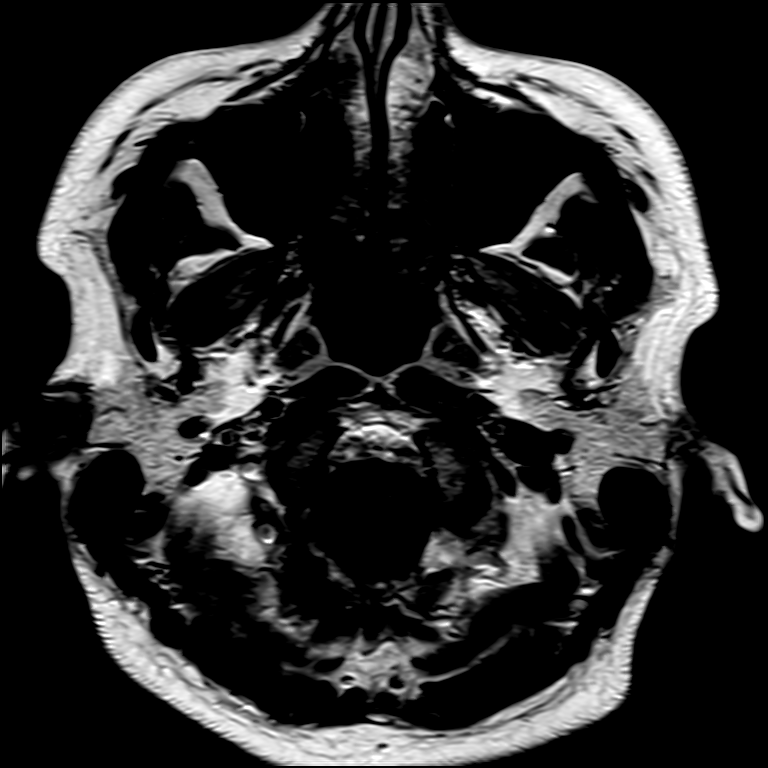

[Series 16: T1 post-contrast · coronal · 3.0mm · 0.21mm/px · 1 of 13 slices shown (2 of 3)]
[im 1/13]
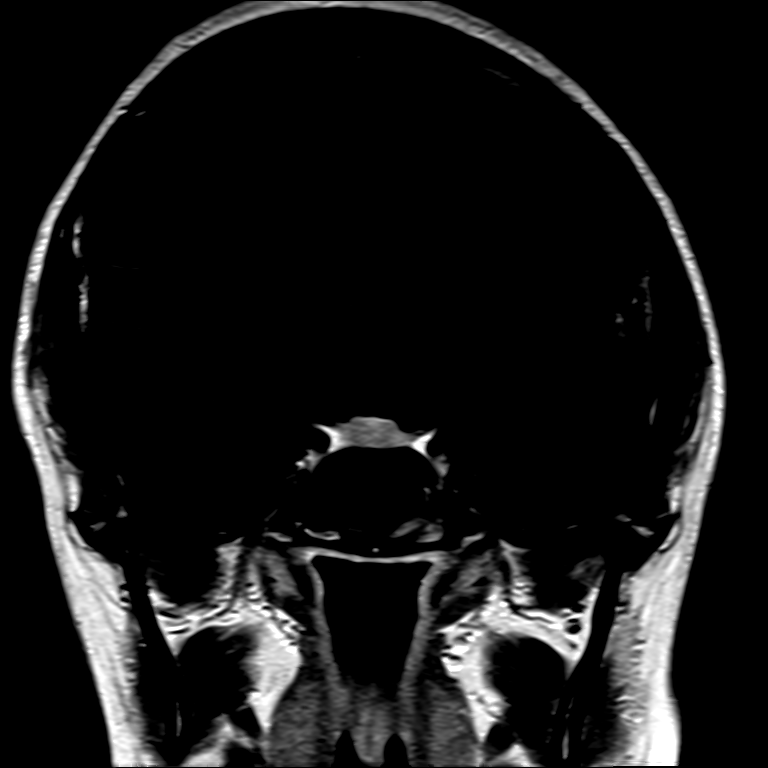

[Series 17: T1 post-contrast · axial · 1.0mm · 0.98mm/px · z∈[-36,+135]mm · 9 of 175 slices shown (3 of 3)]
[im 1/175]
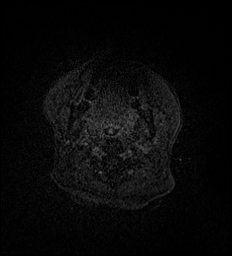
[im 30/175]
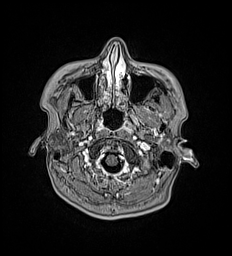
[im 59/175]
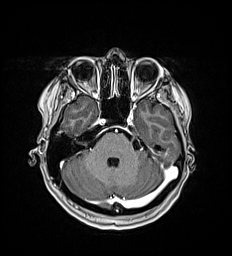
[im 73/175]
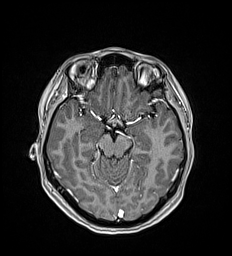
[im 88/175]
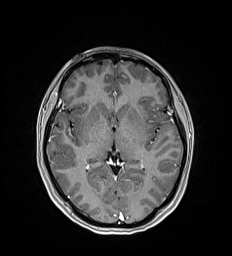
[im 102/175]
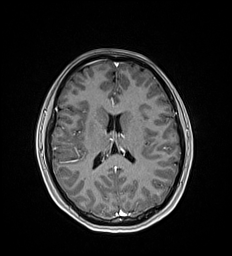
[im 117/175]
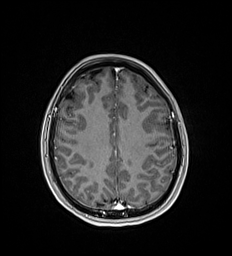
[im 146/175]
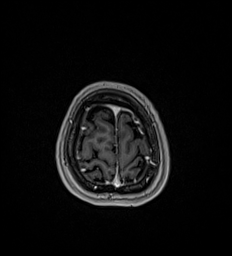
[im 175/175]
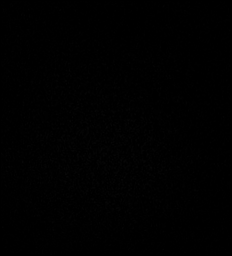

[27 of 48 positions shown; findings below may reference images not displayed]

FINDINGS: Brain: The brain has a normal appearance without evidence of
malformation, atrophy, old or acute small or large vessel
infarction, mass lesion, hemorrhage, hydrocephalus or extra-axial
collection. After contrast administration, no abnormal enhancement
occurs. No evidence of vestibular schwannoma or other CP angle
lesion.

Vascular: Major vessels at the base of the brain show flow. Venous
sinuses appear patent.

Skull and upper cervical spine: Normal.

Sinuses/Orbits: Clear/normal.

Other: None significant.
IMPRESSION: Normal examination. No abnormality seen to explain clinical
presentation.

## 2019-06-13 ENCOUNTER — Other Ambulatory Visit: Payer: Medicaid Other

## 2019-07-25 ENCOUNTER — Ambulatory Visit: Payer: Medicaid Other | Attending: Internal Medicine

## 2019-07-25 ENCOUNTER — Other Ambulatory Visit: Payer: Medicaid Other

## 2019-07-25 DIAGNOSIS — Z20822 Contact with and (suspected) exposure to covid-19: Secondary | ICD-10-CM

## 2019-07-26 LAB — NOVEL CORONAVIRUS, NAA: SARS-CoV-2, NAA: NOT DETECTED

## 2019-10-12 ENCOUNTER — Other Ambulatory Visit: Payer: Medicaid Other

## 2019-11-28 ENCOUNTER — Encounter: Payer: Medicaid Other | Admitting: Certified Nurse Midwife

## 2019-11-30 ENCOUNTER — Encounter: Payer: Medicaid Other | Admitting: Certified Nurse Midwife

## 2020-02-14 IMAGING — US ULTRASOUND FETAL BPP WITH NON-STRESS
1 series · 2 of 2 positions shown · non-contrast
Comparison: none

CLINICAL DATA: Post-dates.

EXAM:
LIMITED OBSTETRICAL ULTRASOUND >14 WKS AND BIOPHYSICAL PROFILE

[Series 1: ultrasound fetal bpp with non-stress · 2 of 2 slices shown]
[im 1/2]
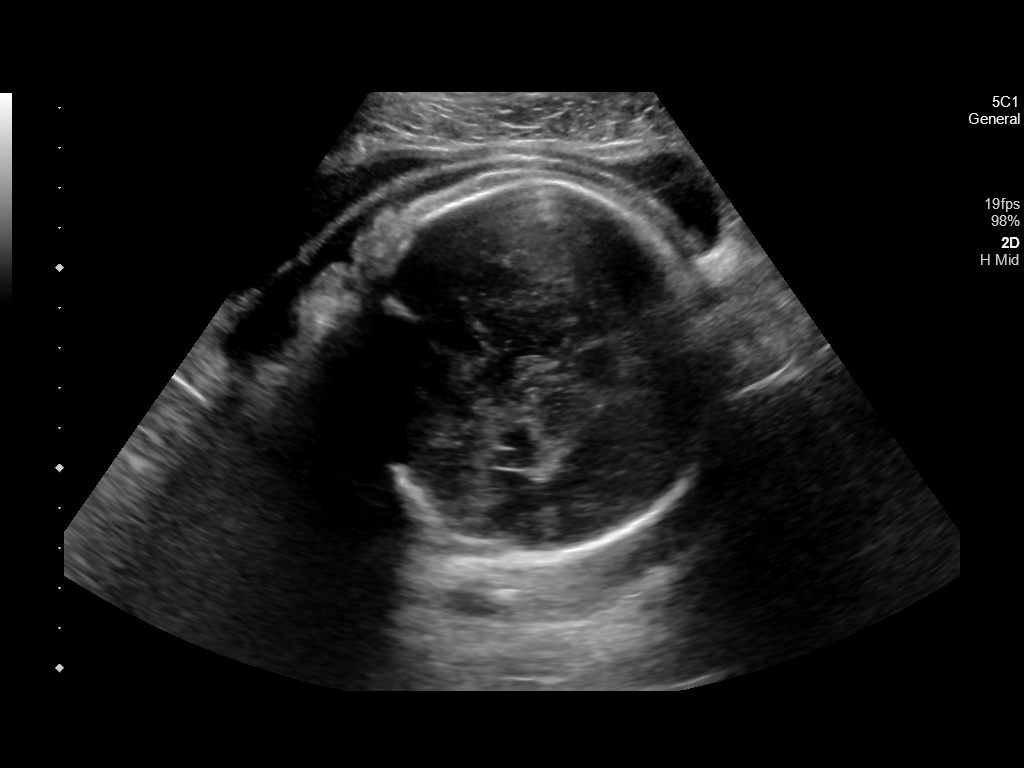
[im 2/2]
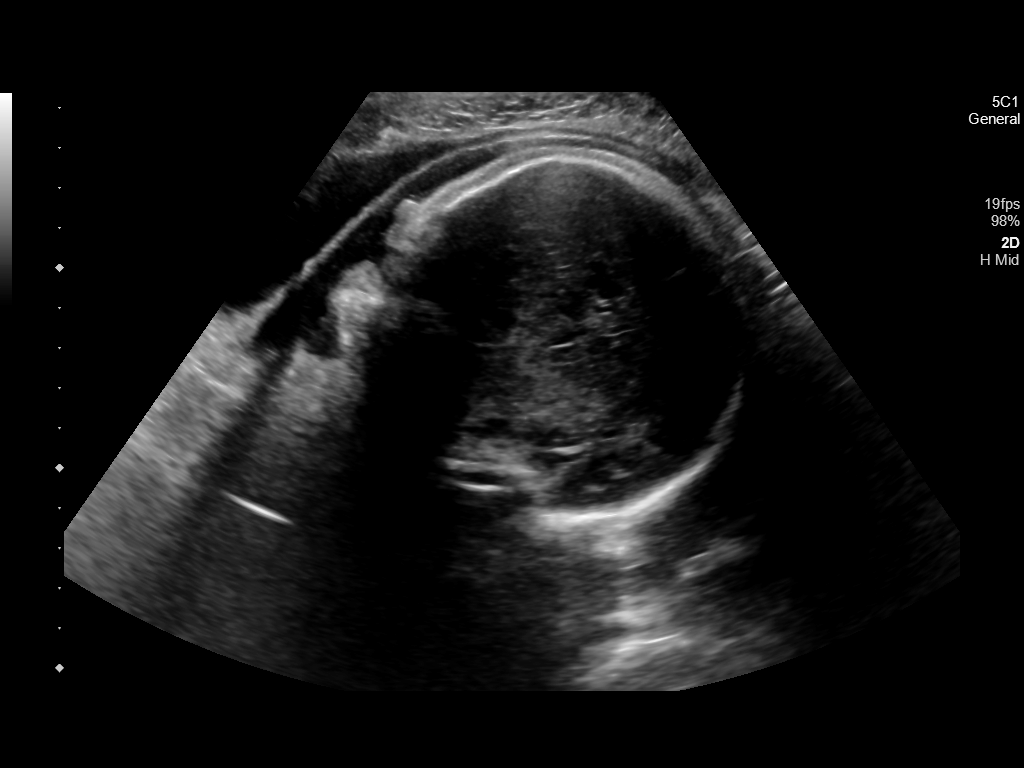

[2 of 2 positions shown; findings below may reference images not displayed]

FINDINGS: Exam available for my review on today's date.

Number of Fetuses: 1

Heart Rate:  149 bpm

Movement: Present

Presentation: Cephalic

Previa: No

Placental Location: Anterior

Amniotic Fluid (Subjective): Decreased

Amniotic Fluid (Objective):

AFI 3.8 cm (5%ile= 7.1 cm, 95%= 21.4 cm

BIOPHYSICAL PROFILE

Movement: 2 time:  minutes

Breathing: 2

Tone:  2

Amniotic Fluid: 2

Total Score:  8
IMPRESSION: 1. Single viable intrauterine pregnancy in cephalic presentation.
Low amniotic fluid volume.

2.  Biophysical profile score is [DATE].

These results will be called to the ordering clinician or
representative by the Radiologist Assistant, and communication
documented in the PACS or zVision Dashboard.

## 2020-02-14 IMAGING — US LIMITED OBSTETRIC ULTRASOUND
1 series · 14 of 18 positions shown · non-contrast
Comparison: none

CLINICAL DATA: Post-dates.

EXAM:
LIMITED OBSTETRICAL ULTRASOUND >14 WKS AND BIOPHYSICAL PROFILE

[Series 1: limited obstetric ultrasound · 18 acquisitions, 14 frames shown]
[im 1/18]
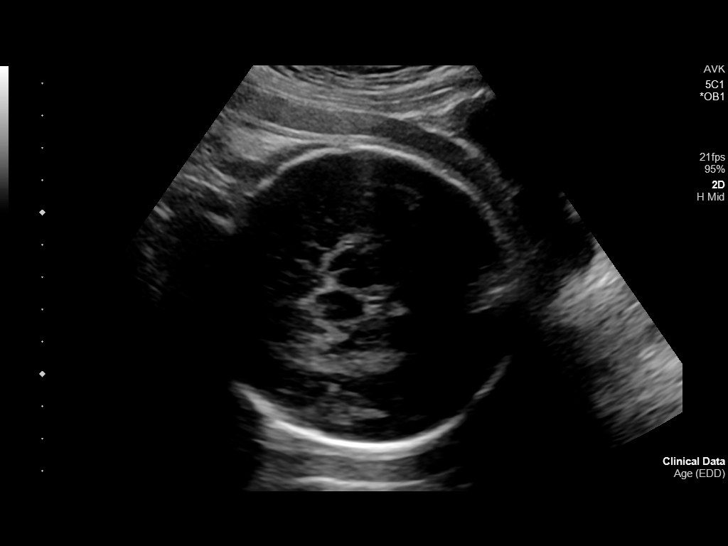
[im 2/18]
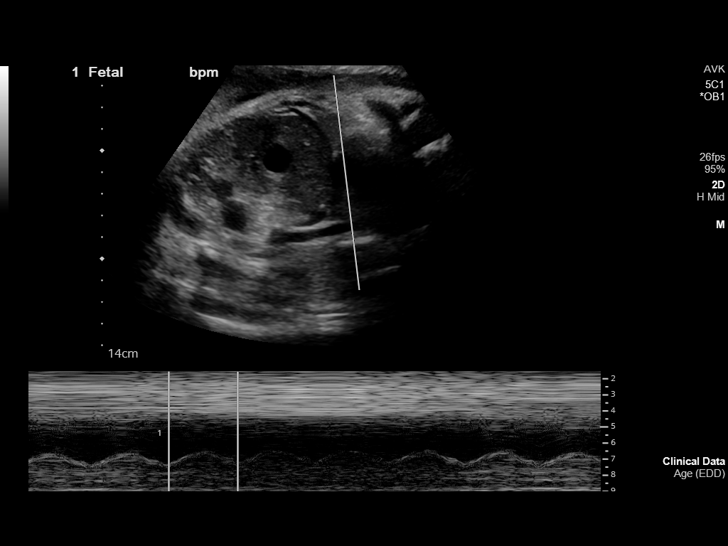
[im 4/18]
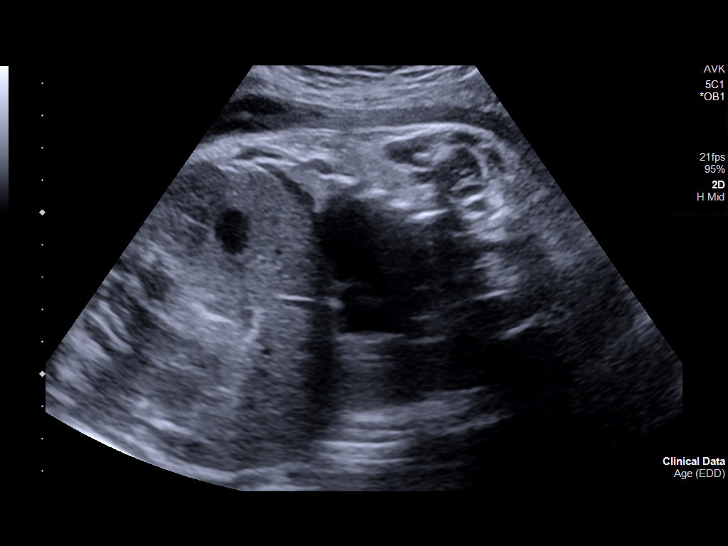
[im 5/18]
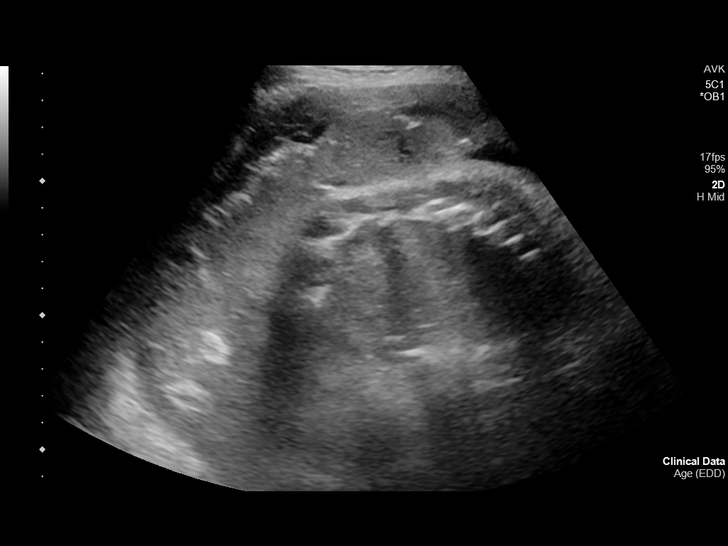
[im 6/18]
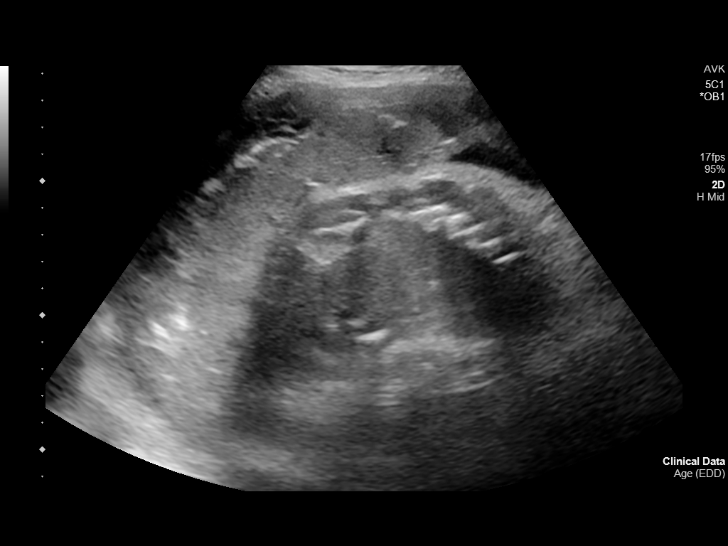
[im 8/18]
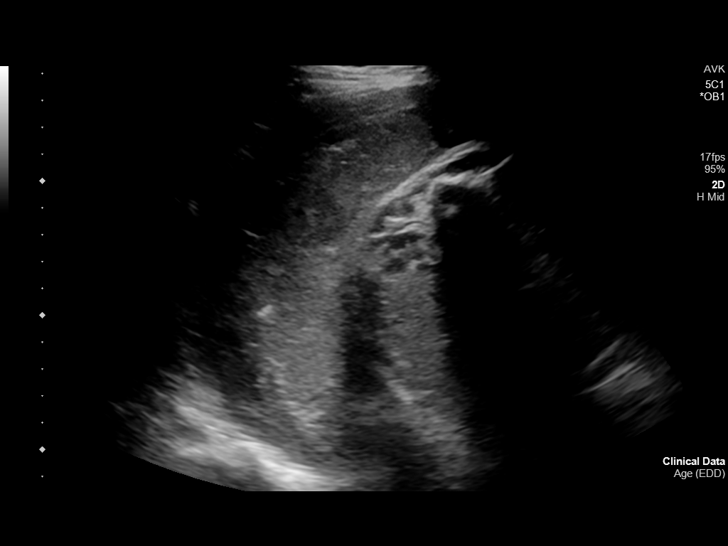
[im 9/18]
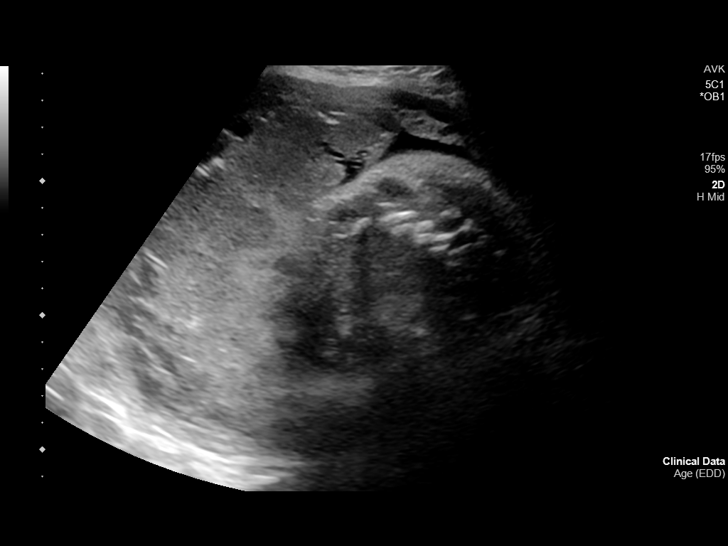
[im 10/18]
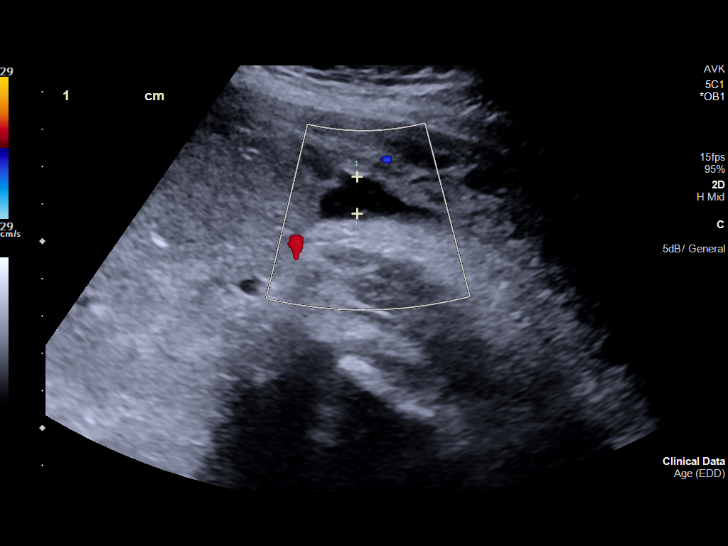
[im 11/18]
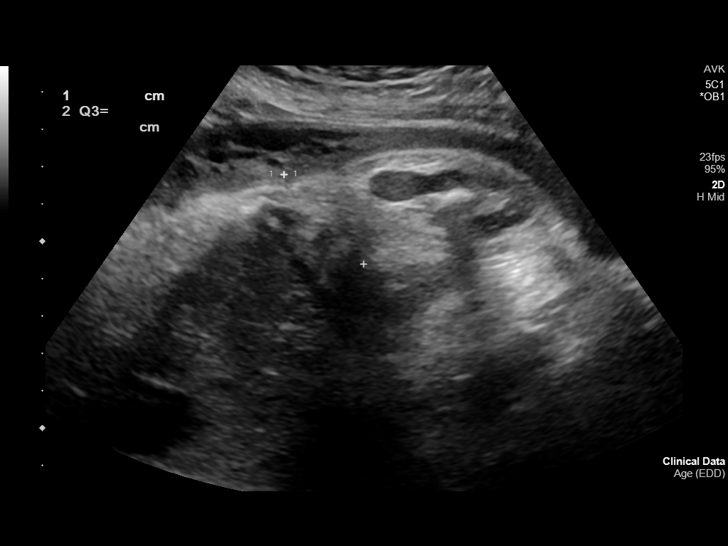
[im 13/18]
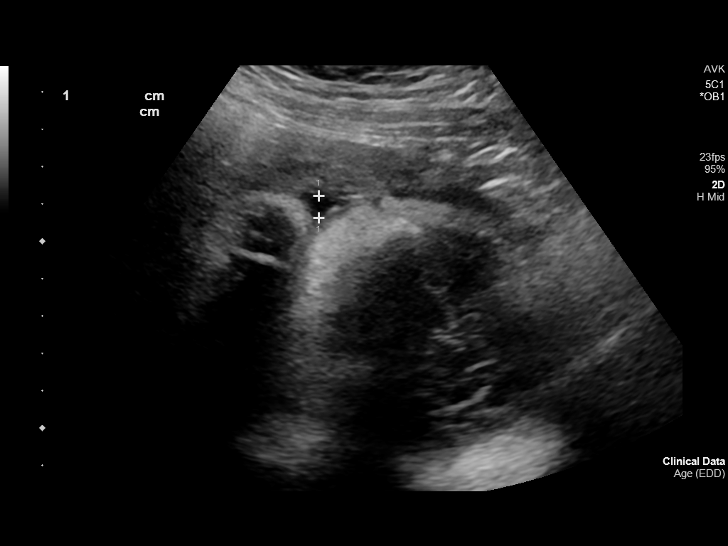
[im 14/18]
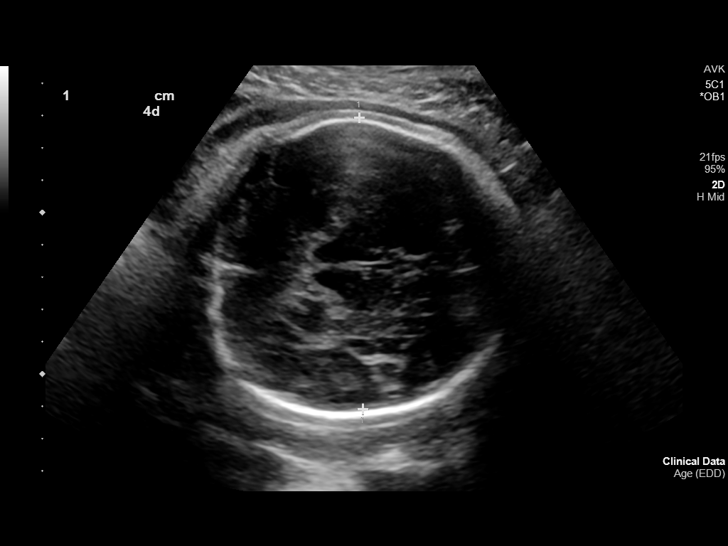
[im 15/18]
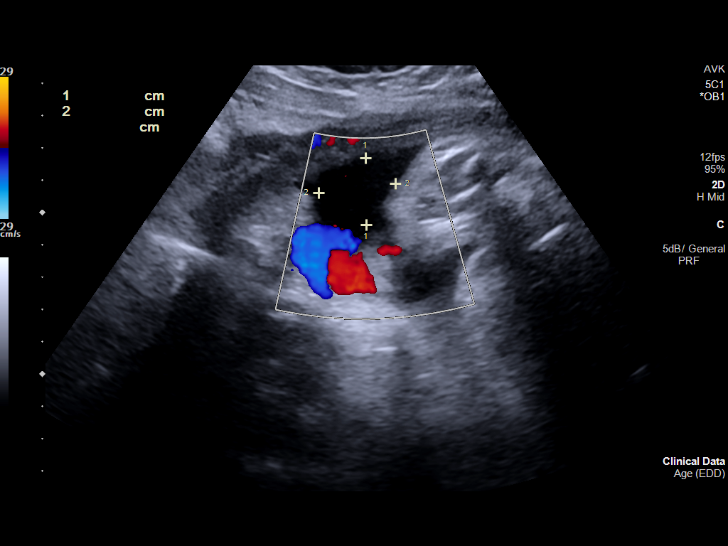
[im 17/18]
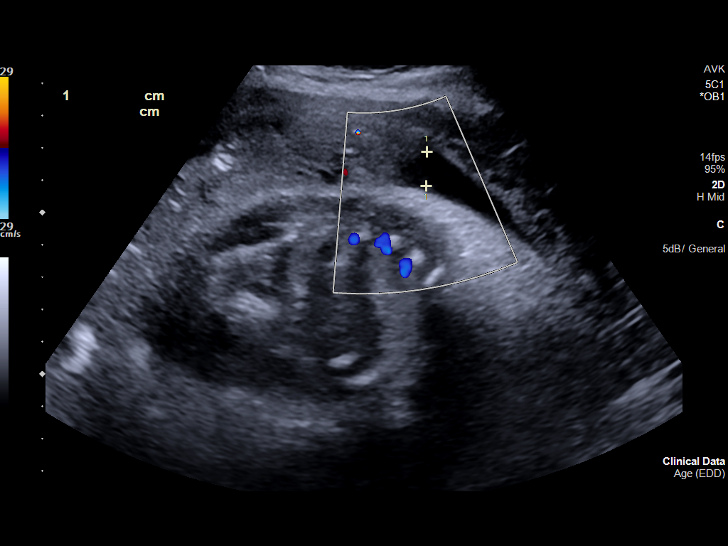
[im 18/18]
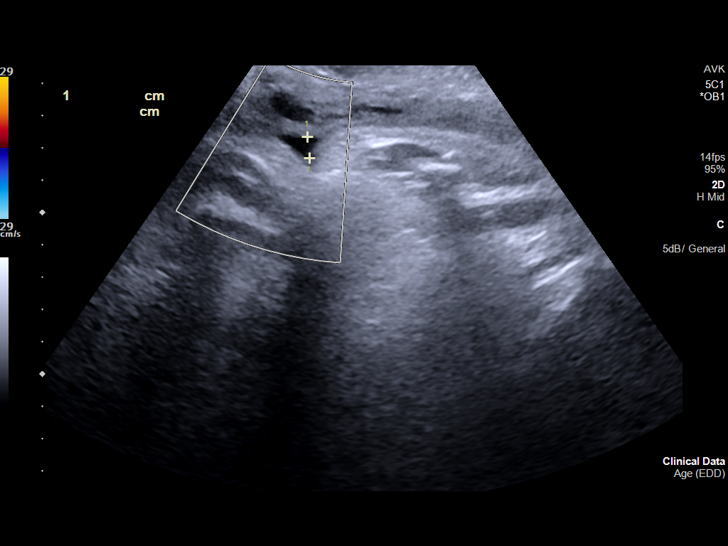

[14 of 18 positions shown; findings below may reference images not displayed]

FINDINGS: Exam available for my review on today's date.

Number of Fetuses: 1

Heart Rate:  149 bpm

Movement: Present

Presentation: Cephalic

Previa: No

Placental Location: Anterior

Amniotic Fluid (Subjective): Decreased

Amniotic Fluid (Objective):

AFI 3.8 cm (5%ile= 7.1 cm, 95%= 21.4 cm

BIOPHYSICAL PROFILE

Movement: 2 time:  minutes

Breathing: 2

Tone:  2

Amniotic Fluid: 2

Total Score:  8
IMPRESSION: 1. Single viable intrauterine pregnancy in cephalic presentation.
Low amniotic fluid volume.

2.  Biophysical profile score is [DATE].

These results will be called to the ordering clinician or
representative by the Radiologist Assistant, and communication
documented in the PACS or zVision Dashboard.

## 2020-12-15 IMAGING — US OBSTETRIC 14+ WK ULTRASOUND
1 series · 13 of 28 positions shown · non-contrast
Comparison: none

CLINICAL DATA: Scan for anatomy. The patient is 32 year old gravida
2 para 1. Five signs EDC of 01/10/2019, the patient is 20 weeks 3
days.

EXAM:
OBSTETRICAL ULTRASOUND >14 WKS

[Series 1: obstetric 14+ wk ultrasound · 0.28mm/px · 13 of 65 slices shown]
[im 3/65]
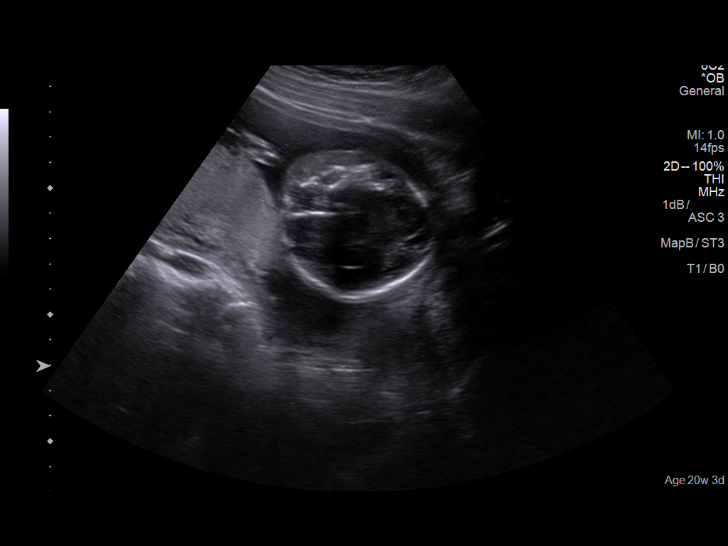
[im 8/65]
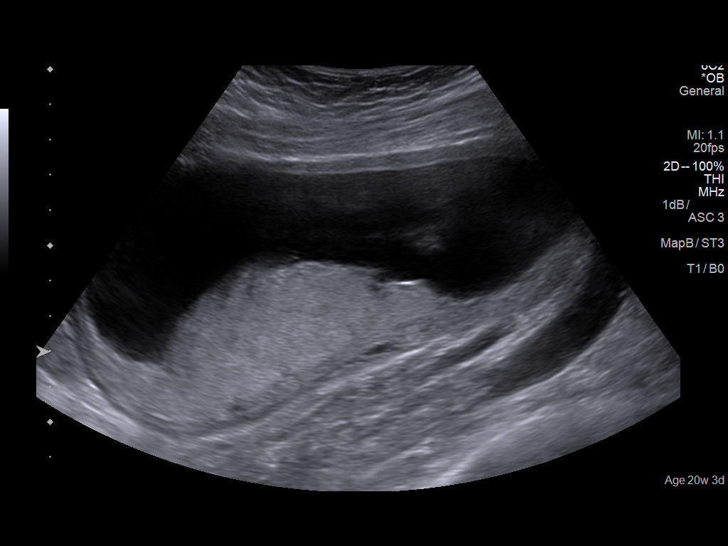
[im 12/65]
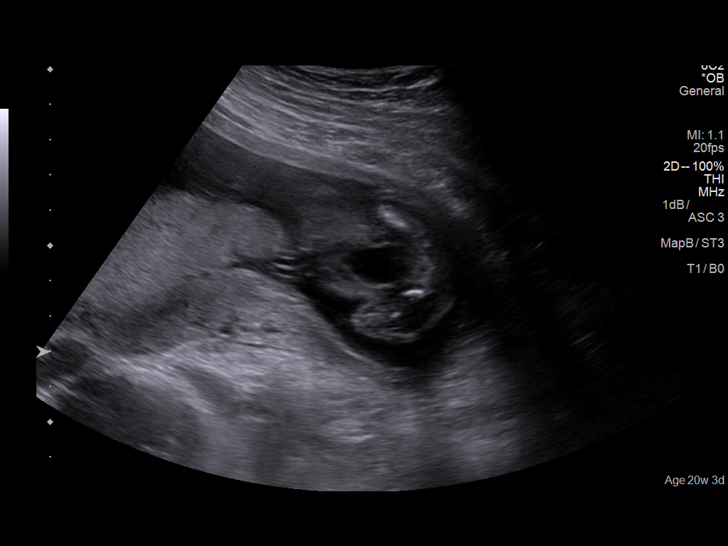
[im 17/65]
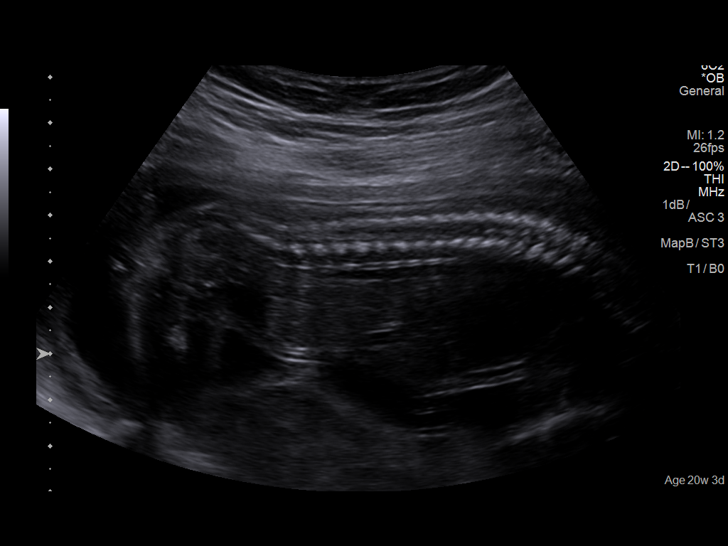
[im 22/65]
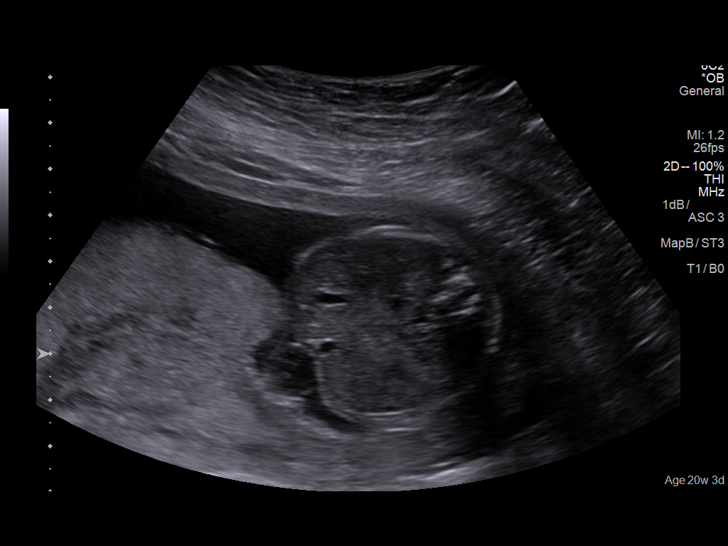
[im 27/65]
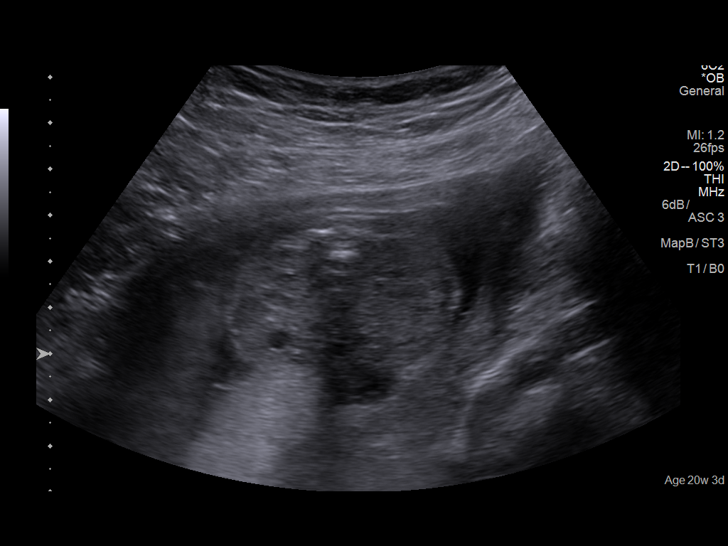
[im 34/65]
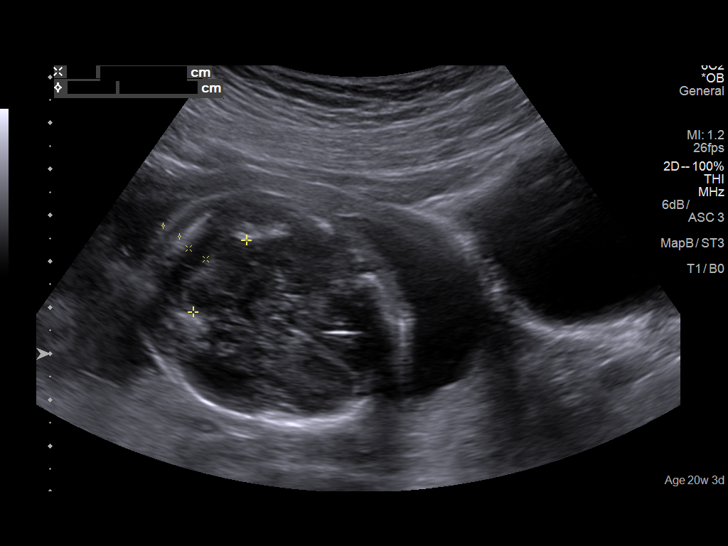
[im 38/65]
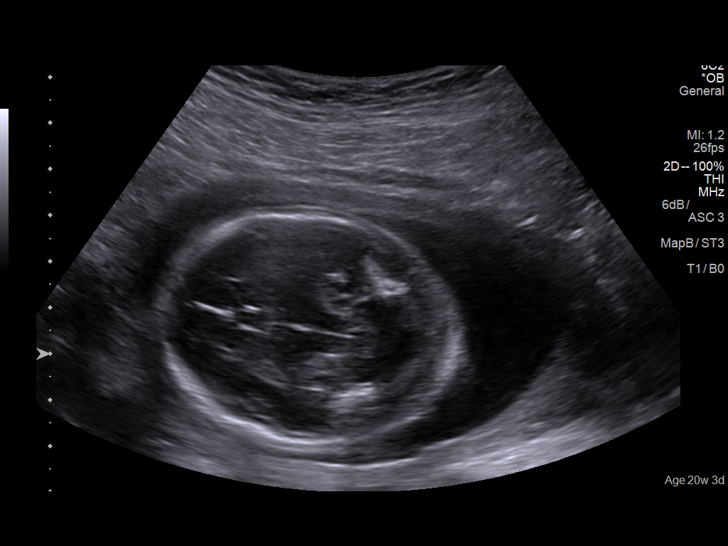
[im 43/65]
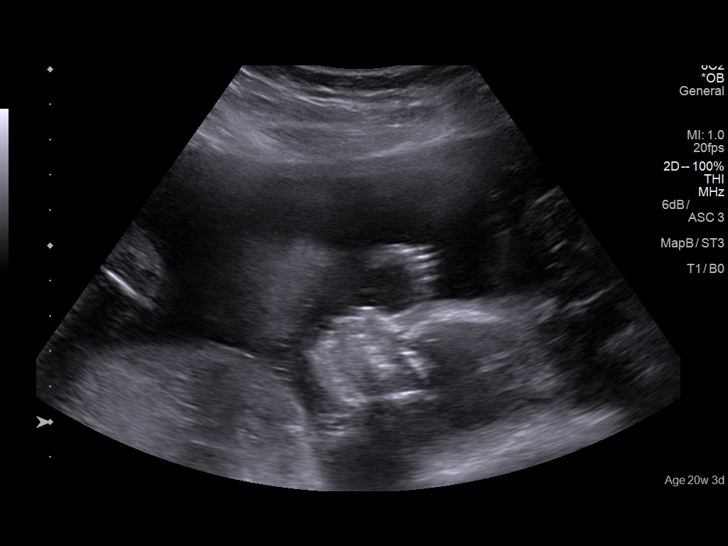
[im 48/65]
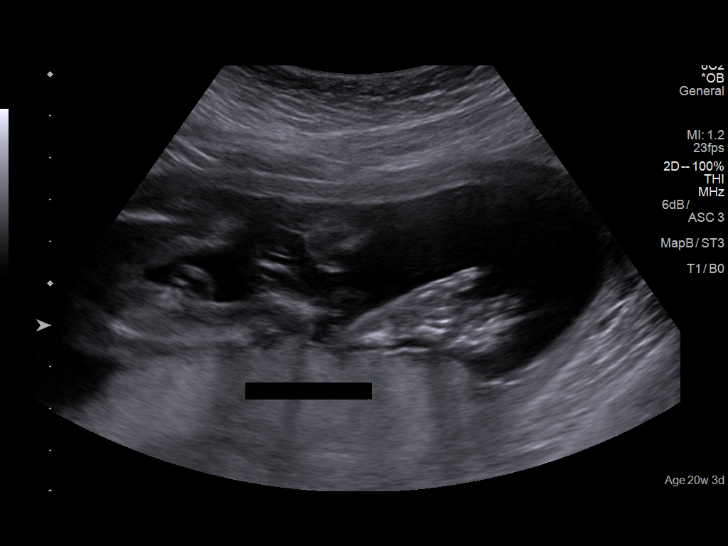
[im 53/65]
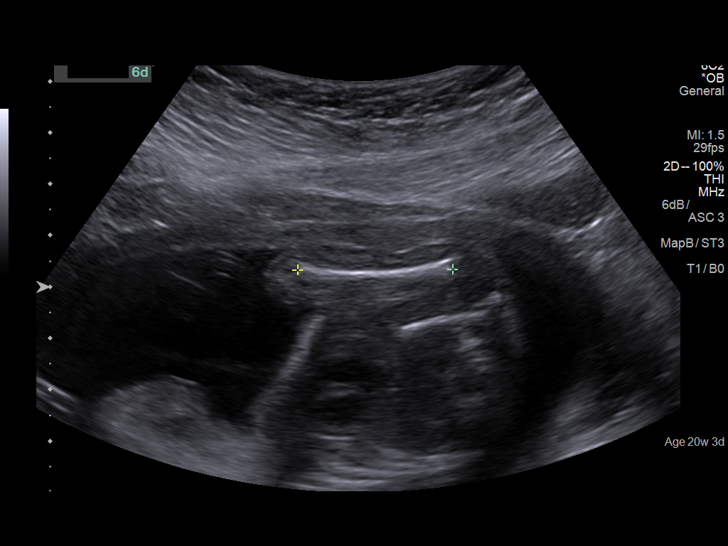
[im 57/65]
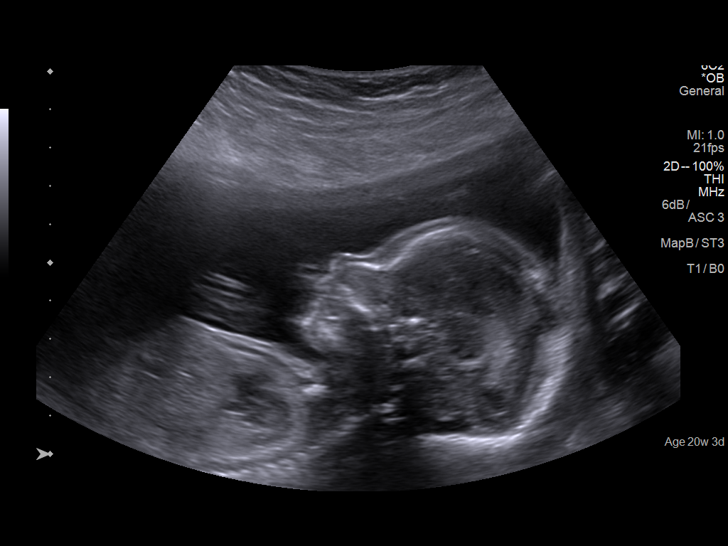
[im 62/65]
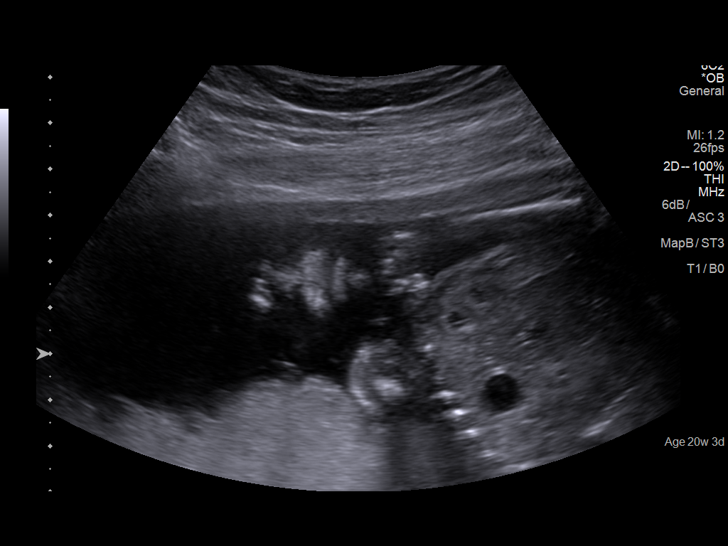

[13 of 28 positions shown; findings below may reference images not displayed]

FINDINGS: Number of Fetuses: 1

Heart Rate:  163 bpm

Movement: Present

Presentation: Cephalic

Previa: None

Placental Location: Posterior

Amniotic Fluid (Subjective): Normal

Amniotic Fluid (Objective):

Vertical pocket = 3.6cm

FETAL BIOMETRY

BPD: 4.76cm 20w 3d

HC:   17.63cm 20w 1d

AC:   14.87cm 20w 1d

FL:   3.2cm 20w 0d

Current Mean GA: 20w 1d US EDC: 01/12/2019

Assigned GA:  20w 3d Assigned EDC: 01/10/2019

FETAL ANATOMY

Lateral Ventricles: Appears normal

Thalami/CSP: Appears normal

Posterior Fossa:  Appears normal

Nuchal Region: Appears normal   NFT= 4.2 millimeters

Upper Lip: Appears normal

Spine: Appears normal

4 Chamber Heart on Left: Appears normal

LVOT: Appears normal

RVOT: Appears normal

Stomach on Left: Appears normal

3 Vessel Cord: Appears normal

Cord Insertion site: Appears normal

Kidneys: Appears normal

Bladder: Appears normal

Extremities: Appears normal

Technically difficult due to: Not applicable

Maternal Findings:

Cervix:  3.9 centimeters on transabdominal evaluation
IMPRESSION: 1. Single living intrauterine fetus in cephalic presentation.
2. Size and dates correlate well.
3. No fetal anomalies identified.

## 2021-04-08 ENCOUNTER — Other Ambulatory Visit: Payer: Self-pay

## 2021-04-08 ENCOUNTER — Ambulatory Visit (INDEPENDENT_AMBULATORY_CARE_PROVIDER_SITE_OTHER): Payer: 59 | Admitting: Certified Nurse Midwife

## 2021-04-08 ENCOUNTER — Encounter: Payer: Self-pay | Admitting: Certified Nurse Midwife

## 2021-04-08 VITALS — BP 150/92 | HR 76 | Ht <= 58 in | Wt 127.9 lb

## 2021-04-08 DIAGNOSIS — R32 Unspecified urinary incontinence: Secondary | ICD-10-CM

## 2021-04-08 DIAGNOSIS — N939 Abnormal uterine and vaginal bleeding, unspecified: Secondary | ICD-10-CM

## 2021-04-08 NOTE — Patient Instructions (Signed)
Kegel Exercises  Kegel exercises can help strengthen your pelvic floor muscles. The pelvic floor is a group of muscles that support your rectum, small intestine, and bladder. In females, pelvic floor muscles also help support the womb (uterus). These muscles help you control the flow of urine and stool. Kegel exercises are painless and simple, and they do not require any equipment. Your provider may suggest Kegel exercises to: Improve bladder and bowel control. Improve sexual response. Improve weak pelvic floor muscles after surgery to remove the uterus (hysterectomy) or pregnancy (females). Improve weak pelvic floor muscles after prostate gland removal or surgery (males). Kegel exercises involve squeezing your pelvic floor muscles, which are the same muscles you squeeze when you try to stop the flow of urine or keep from passing gas. The exercises can be done while sitting, standing, or lying down, but itis best to vary your position. Exercises How to do Kegel exercises: Squeeze your pelvic floor muscles tight. You should feel a tight lift in your rectal area. If you are a female, you should also feel a tightness in your vaginal area. Keep your stomach, buttocks, and legs relaxed. Hold the muscles tight for up to 10 seconds. Breathe normally. Relax your muscles. Repeat as told by your health care provider. Repeat this exercise daily as told by your health care provider. Continue to do this exercise for at least 4-6 weeks, or for as long as told by your healthcare provider. You may be referred to a physical therapist who can help you learn more abouthow to do Kegel exercises. Depending on your condition, your health care provider may recommend: Varying how long you squeeze your muscles. Doing several sets of exercises every day. Doing exercises for several weeks. Making Kegel exercises a part of your regular exercise routine. This information is not intended to replace advice given to you by  your health care provider. Make sure you discuss any questions you have with your healthcare provider. Document Revised: 05/29/2020 Document Reviewed: 01/26/2018 Elsevier Patient Education  2022 Elsevier Inc.  
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
## 2021-05-01 ENCOUNTER — Other Ambulatory Visit: Payer: Self-pay

## 2021-05-01 ENCOUNTER — Ambulatory Visit (INDEPENDENT_AMBULATORY_CARE_PROVIDER_SITE_OTHER): Payer: 59

## 2021-05-01 DIAGNOSIS — N939 Abnormal uterine and vaginal bleeding, unspecified: Secondary | ICD-10-CM | POA: Diagnosis not present

## 2021-05-13 ENCOUNTER — Encounter: Payer: Self-pay | Admitting: Certified Nurse Midwife

## 2021-05-20 ENCOUNTER — Other Ambulatory Visit: Payer: Self-pay | Admitting: Certified Nurse Midwife

## 2021-05-20 MED ORDER — TRANEXAMIC ACID 650 MG PO TABS: 1300.0000 mg | ORAL_TABLET | Freq: Two times a day (BID) | ORAL | 0 refills | Status: AC
